# Patient Record
Sex: Female | Born: 1975
Health system: Southern US, Community
[De-identification: ages and names within clinical notes are randomized; demographics above are authoritative.]

## PROBLEM LIST (undated history)

## (undated) DIAGNOSIS — J302 Other seasonal allergic rhinitis: Secondary | ICD-10-CM

## (undated) DIAGNOSIS — K5190 Ulcerative colitis, unspecified, without complications: Secondary | ICD-10-CM

## (undated) DIAGNOSIS — N289 Disorder of kidney and ureter, unspecified: Secondary | ICD-10-CM

## (undated) DIAGNOSIS — N946 Dysmenorrhea, unspecified: Secondary | ICD-10-CM

## (undated) DIAGNOSIS — S3210XA Unspecified fracture of sacrum, initial encounter for closed fracture: Secondary | ICD-10-CM

## (undated) DIAGNOSIS — S52209A Unspecified fracture of shaft of unspecified ulna, initial encounter for closed fracture: Secondary | ICD-10-CM

## (undated) DIAGNOSIS — K649 Unspecified hemorrhoids: Secondary | ICD-10-CM

## (undated) DIAGNOSIS — D649 Anemia, unspecified: Secondary | ICD-10-CM

## (undated) DIAGNOSIS — G43909 Migraine, unspecified, not intractable, without status migrainosus: Secondary | ICD-10-CM

## (undated) DIAGNOSIS — F32A Depression, unspecified: Secondary | ICD-10-CM

## (undated) DIAGNOSIS — F419 Anxiety disorder, unspecified: Secondary | ICD-10-CM

## (undated) DIAGNOSIS — Z973 Presence of spectacles and contact lenses: Secondary | ICD-10-CM

## (undated) DIAGNOSIS — S5290XA Unspecified fracture of unspecified forearm, initial encounter for closed fracture: Secondary | ICD-10-CM

## (undated) DIAGNOSIS — Z87442 Personal history of urinary calculi: Secondary | ICD-10-CM

## (undated) DIAGNOSIS — K589 Irritable bowel syndrome without diarrhea: Secondary | ICD-10-CM

## (undated) DIAGNOSIS — N200 Calculus of kidney: Secondary | ICD-10-CM

## (undated) DIAGNOSIS — Z8489 Family history of other specified conditions: Secondary | ICD-10-CM

## (undated) DIAGNOSIS — Z860101 Personal history of adenomatous and serrated colon polyps: Secondary | ICD-10-CM

## (undated) DIAGNOSIS — K580 Irritable bowel syndrome with diarrhea: Secondary | ICD-10-CM

## (undated) DIAGNOSIS — R102 Pelvic and perineal pain: Secondary | ICD-10-CM

## (undated) DIAGNOSIS — Z789 Other specified health status: Secondary | ICD-10-CM

## (undated) DIAGNOSIS — N921 Excessive and frequent menstruation with irregular cycle: Secondary | ICD-10-CM

## (undated) DIAGNOSIS — G8929 Other chronic pain: Secondary | ICD-10-CM

## (undated) DIAGNOSIS — R519 Headache, unspecified: Secondary | ICD-10-CM

## (undated) DIAGNOSIS — N8003 Adenomyosis of the uterus: Secondary | ICD-10-CM

## (undated) HISTORY — DX: Headache, unspecified: R51.9

## (undated) HISTORY — DX: Irritable bowel syndrome, unspecified: K58.9

## (undated) HISTORY — DX: Anemia, unspecified: D64.9

## (undated) HISTORY — DX: Calculus of kidney: N20.0

## (undated) HISTORY — DX: Migraine, unspecified, not intractable, without status migrainosus: G43.909

## (undated) HISTORY — DX: Other chronic pain: G89.29

## (undated) HISTORY — DX: Ulcerative colitis, unspecified, without complications: K51.90

## (undated) HISTORY — DX: Depression, unspecified: F32.A

## (undated) HISTORY — DX: Anxiety disorder, unspecified: F41.9

---
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
## 2006-01-30 ENCOUNTER — Ambulatory Visit: Payer: Self-pay | Admitting: Internal Medicine

## 2006-01-31 ENCOUNTER — Ambulatory Visit: Payer: Self-pay | Admitting: Internal Medicine

## 2006-02-14 ENCOUNTER — Ambulatory Visit: Payer: Self-pay | Admitting: Internal Medicine

## 2006-06-22 ENCOUNTER — Ambulatory Visit: Payer: Self-pay | Admitting: Internal Medicine

## 2006-11-24 ENCOUNTER — Emergency Department (HOSPITAL_COMMUNITY): Admission: EM | Admit: 2006-11-24 | Discharge: 2006-11-24 | Payer: Self-pay | Admitting: Emergency Medicine

## 2007-02-23 ENCOUNTER — Inpatient Hospital Stay (HOSPITAL_COMMUNITY): Admission: AD | Admit: 2007-02-23 | Discharge: 2007-02-25 | Payer: Self-pay | Admitting: Obstetrics and Gynecology

## 2007-06-19 ENCOUNTER — Ambulatory Visit: Payer: Self-pay | Admitting: Internal Medicine

## 2007-09-01 ENCOUNTER — Ambulatory Visit (HOSPITAL_COMMUNITY): Admission: RE | Admit: 2007-09-01 | Discharge: 2007-09-01 | Payer: Self-pay | Admitting: Obstetrics and Gynecology

## 2007-10-11 ENCOUNTER — Ambulatory Visit: Payer: Self-pay | Admitting: Internal Medicine

## 2007-10-11 DIAGNOSIS — Z87442 Personal history of urinary calculi: Secondary | ICD-10-CM | POA: Insufficient documentation

## 2007-10-11 DIAGNOSIS — G43009 Migraine without aura, not intractable, without status migrainosus: Secondary | ICD-10-CM | POA: Insufficient documentation

## 2007-10-11 DIAGNOSIS — J309 Allergic rhinitis, unspecified: Secondary | ICD-10-CM | POA: Insufficient documentation

## 2008-05-12 ENCOUNTER — Ambulatory Visit: Payer: Self-pay | Admitting: Internal Medicine

## 2008-05-12 DIAGNOSIS — R1032 Left lower quadrant pain: Secondary | ICD-10-CM

## 2008-05-12 DIAGNOSIS — F4321 Adjustment disorder with depressed mood: Secondary | ICD-10-CM

## 2008-05-12 DIAGNOSIS — R110 Nausea: Secondary | ICD-10-CM | POA: Insufficient documentation

## 2008-05-12 LAB — CONVERTED CEMR LAB
AST: 22 units/L (ref 0–37)
Albumin: 4.4 g/dL (ref 3.5–5.2)
Alkaline Phosphatase: 77 units/L (ref 39–117)
Basophils Absolute: 0.1 10*3/uL (ref 0.0–0.1)
HCT: 40.6 % (ref 36.0–46.0)
Hemoglobin: 14 g/dL (ref 12.0–15.0)
Lymphocytes Relative: 26.4 % (ref 12.0–46.0)
MCV: 90.2 fL (ref 78.0–100.0)
Monocytes Absolute: 0.5 10*3/uL (ref 0.1–1.0)
Monocytes Relative: 6.3 % (ref 3.0–12.0)
Neutro Abs: 5.6 10*3/uL (ref 1.4–7.7)
RDW: 12.9 % (ref 11.5–14.6)
T3, Free: 3 pg/mL (ref 2.3–4.2)
TSH: 1.71 microintl units/mL (ref 0.35–5.50)
Total Bilirubin: 0.7 mg/dL (ref 0.3–1.2)
Total Protein: 8.2 g/dL (ref 6.0–8.3)
hCG, Beta Chain, Quant, S: 0.88 milliintl units/mL

## 2008-05-13 ENCOUNTER — Telehealth: Payer: Self-pay | Admitting: Internal Medicine

## 2008-05-28 ENCOUNTER — Ambulatory Visit: Payer: Self-pay | Admitting: Licensed Clinical Social Worker

## 2008-06-04 ENCOUNTER — Ambulatory Visit: Payer: Self-pay | Admitting: Licensed Clinical Social Worker

## 2008-06-30 ENCOUNTER — Ambulatory Visit: Payer: Self-pay | Admitting: Internal Medicine

## 2008-06-30 DIAGNOSIS — L738 Other specified follicular disorders: Secondary | ICD-10-CM

## 2009-01-06 ENCOUNTER — Encounter: Payer: Self-pay | Admitting: Internal Medicine

## 2009-06-01 ENCOUNTER — Inpatient Hospital Stay (HOSPITAL_COMMUNITY): Admission: AD | Admit: 2009-06-01 | Discharge: 2009-06-03 | Payer: Self-pay | Admitting: Obstetrics

## 2011-02-05 LAB — CBC
Platelets: 159 10*3/uL (ref 150–400)
RBC: 3.97 MIL/uL (ref 3.87–5.11)
RDW: 14.3 % (ref 11.5–15.5)

## 2011-05-26 LAB — ABO/RH

## 2011-05-26 LAB — ANTIBODY SCREEN: Antibody Screen: NEGATIVE

## 2011-11-01 NOTE — L&D Delivery Note (Signed)
Delivery Note  AROM at 2250 - dilation 7 cm Complete dilation at 2328 with strong urge to push Onset of pushing at 2328 - birth with single push FHR second stage 150 rate  Anesthesia: none  Delivery of a viable female at 2328 by CNM in LOA position.  Elmyra Ricks) Nuchal Cord- none. Cord double clamped after cessation of pulsation, cut by FOB.  Cord blood sample collected.    Placenta delivered 2335 intact with 3 VC.  Placenta to L&D for disposal. Uterine tone firm / small bleeding   no laceration identified Repair- none Est. Blood Loss (mL):300  Complications: none  Mom to postpartum.  Baby to Mom for breastfeeding.  Cathy Turner 12/07/2011, 11:47 PM

## 2011-11-17 LAB — STREP B DNA PROBE: GBS: NEGATIVE

## 2011-12-07 ENCOUNTER — Encounter (HOSPITAL_COMMUNITY): Payer: Self-pay | Admitting: *Deleted

## 2011-12-07 ENCOUNTER — Inpatient Hospital Stay (HOSPITAL_COMMUNITY)
Admission: AD | Admit: 2011-12-07 | Discharge: 2011-12-09 | DRG: 775 | Disposition: A | Discharge status: Home / Self Care | Payer: Managed Care, Other (non HMO) | Source: Ambulatory Visit | Attending: Obstetrics and Gynecology | Admitting: Obstetrics and Gynecology

## 2011-12-07 DIAGNOSIS — L738 Other specified follicular disorders: Secondary | ICD-10-CM

## 2011-12-07 DIAGNOSIS — G43009 Migraine without aura, not intractable, without status migrainosus: Secondary | ICD-10-CM

## 2011-12-07 DIAGNOSIS — R1032 Left lower quadrant pain: Secondary | ICD-10-CM

## 2011-12-07 DIAGNOSIS — Z87442 Personal history of urinary calculi: Secondary | ICD-10-CM

## 2011-12-07 DIAGNOSIS — R110 Nausea: Secondary | ICD-10-CM

## 2011-12-07 DIAGNOSIS — F4321 Adjustment disorder with depressed mood: Secondary | ICD-10-CM

## 2011-12-07 DIAGNOSIS — J309 Allergic rhinitis, unspecified: Secondary | ICD-10-CM

## 2011-12-07 HISTORY — DX: Other specified health status: Z78.9

## 2011-12-07 MED ORDER — CITRIC ACID-SODIUM CITRATE 334-500 MG/5ML PO SOLN: 30.0000 mL | ORAL | Status: DC | PRN

## 2011-12-07 MED ORDER — OXYTOCIN 10 UNIT/ML IJ SOLN
INTRAMUSCULAR | Status: AC
  Filled 2011-12-07: qty 2

## 2011-12-07 MED ORDER — IBUPROFEN 600 MG PO TABS
600.0000 mg | ORAL_TABLET | Freq: Four times a day (QID) | ORAL | Status: DC | PRN
  Administered 2011-12-08: 600 mg via ORAL
  Filled 2011-12-07: qty 1

## 2011-12-07 MED ORDER — ACETAMINOPHEN 325 MG PO TABS: 650.0000 mg | ORAL_TABLET | ORAL | Status: DC | PRN

## 2011-12-07 MED ORDER — LIDOCAINE HCL (PF) 1 % IJ SOLN: 30.0000 mL | INTRAMUSCULAR | Status: DC | PRN

## 2011-12-07 MED ORDER — LIDOCAINE HCL (PF) 1 % IJ SOLN
INTRAMUSCULAR | Status: AC
  Filled 2011-12-07: qty 30

## 2011-12-07 NOTE — ED Notes (Signed)
Pt to go to room 162 for evaluation.

## 2011-12-07 NOTE — Progress Notes (Signed)
Pt G3 P2 at 37.2wks, having contractions every .  SVE today 3-4cm.

## 2011-12-07 NOTE — H&P (Signed)
  OB ADMISSION/ HISTORY & PHYSICAL:  Admission Date: 12/07/2011  9:43 PM  Admit Diagnosis: Onset of labor  Cathy Turner is a 36 y.o. female presenting for active labor - onset around 6pm.  Prenatal History: G3P2 EDC : 12/26/2011 Prenatal care at [facility]  Prenatal course uncomplicated / hx SVD at 37-38 weeks with labors less than 7 hours  Prenatal Labs: ABO, Rh:    Antibody:  negative Rubella:   Immune RPR:   NR HBsAg:   NR HIV:   NR GBS:   negative 1 hr Glucola : nl  Medical / Surgical History :  Past medical history: No past medical history on file.   Past surgical history: No past surgical history on file.   Family History: No family history on file.   Social History:  does not have a smoking history on file. She does not have any smokeless tobacco history on file. Her alcohol and drug histories not on file.   Allergies: Review of patient's allergies indicates not on file.    Current Medications at time of admission:  Prenatal vitamin daily  Review of Systems: Ctx irregular since Sunday + mucus discharge with some blood this am Onset regular ctx ~6pm becoming uncomfortable by 7pm + pressure with ctx and more pink mucus discharge with voids  Physical Exam:   General: Uncomfortable with ctx Heart:RR Lungs: labored with ctx Abdomen: soft / gravid / non-tender Extremities: no edema Genitalia / VE: 5/95% / vtx / 0 / ROT / BBOW / + show  FHR: 145 / moderate TOCO: ctx every 2 minutes     Assessment: 37 2/7 weeks with active labor onset Planning natural childbirth - hx rapid labor progression after ROM  Plan:  Admission AROM with next cervical exam Prepare for birth  Nicolas Sisler 12/07/2011, 10:09 PM

## 2011-12-08 LAB — CBC
HCT: 34.4 % — ABNORMAL LOW (ref 36.0–46.0)
Hemoglobin: 11.3 g/dL — ABNORMAL LOW (ref 12.0–15.0)
MCH: 29.4 pg (ref 26.0–34.0)
MCHC: 32.8 g/dL (ref 30.0–36.0)
MCV: 89.4 fL (ref 78.0–100.0)
Platelets: 182 10*3/uL (ref 150–400)
RBC: 3.85 MIL/uL — ABNORMAL LOW (ref 3.87–5.11)
RDW: 13.9 % (ref 11.5–15.5)
WBC: 19 10*3/uL — ABNORMAL HIGH (ref 4.0–10.5)

## 2011-12-08 LAB — RPR: RPR Ser Ql: NONREACTIVE

## 2011-12-08 MED ORDER — IBUPROFEN 800 MG PO TABS
800.0000 mg | ORAL_TABLET | Freq: Three times a day (TID) | ORAL | Status: DC
  Administered 2011-12-08 – 2011-12-09 (×4): 800 mg via ORAL
  Filled 2011-12-08 (×5): qty 1

## 2011-12-08 MED ORDER — ACETAMINOPHEN 500 MG PO TABS: 1000.0000 mg | ORAL_TABLET | ORAL | Status: DC | PRN

## 2011-12-08 MED ORDER — SIMETHICONE 80 MG PO CHEW
80.0000 mg | CHEWABLE_TABLET | ORAL | Status: DC | PRN
Start: 2011-12-08 — End: 2011-12-09

## 2011-12-08 MED ORDER — WITCH HAZEL-GLYCERIN EX PADS
MEDICATED_PAD | CUTANEOUS | Status: DC | PRN
  Administered 2011-12-09: 06:00:00 via TOPICAL

## 2011-12-08 MED ORDER — MISOPROSTOL 200 MCG PO TABS
800.0000 ug | ORAL_TABLET | Freq: Once | ORAL | Status: AC | PRN
  Filled 2011-12-08: qty 4

## 2011-12-08 MED ORDER — LANOLIN HYDROUS EX OINT: TOPICAL_OINTMENT | CUTANEOUS | Status: DC | PRN

## 2011-12-08 MED ORDER — OXYCODONE-ACETAMINOPHEN 5-325 MG PO TABS: 1.0000 | ORAL_TABLET | ORAL | Status: DC | PRN

## 2011-12-08 MED ORDER — BENZOCAINE-MENTHOL 20-0.5 % EX AERO
1.0000 "application " | INHALATION_SPRAY | CUTANEOUS | Status: DC | PRN
  Filled 2011-12-08: qty 56

## 2011-12-08 MED ORDER — HYDROCORTISONE 2.5 % RE CREA
TOPICAL_CREAM | Freq: Three times a day (TID) | RECTAL | Status: DC
  Administered 2011-12-08: 19:00:00 via RECTAL
  Filled 2011-12-08 (×2): qty 28.35

## 2011-12-08 MED ORDER — PRENATAL MULTIVITAMIN CH
1.0000 | ORAL_TABLET | Freq: Every day | ORAL | Status: DC
  Administered 2011-12-08 – 2011-12-09 (×2): 1 via ORAL
  Filled 2011-12-08 (×2): qty 1

## 2011-12-08 MED ORDER — TETANUS-DIPHTH-ACELL PERTUSSIS 5-2.5-18.5 LF-MCG/0.5 IM SUSP
0.5000 mL | Freq: Once | INTRAMUSCULAR | Status: AC
  Administered 2011-12-08: 0.5 mL via INTRAMUSCULAR

## 2011-12-08 NOTE — Progress Notes (Signed)
Patient ID: Cathy Turner, female   DOB: 04/19/1976, 36 y.o.   MRN: [id number]  PPD 1 SVD  S:  Reports feeling well - + cramps             Tolerating po/ No nausea or vomiting             Bleeding is light             Pain controlled motrin             Up ad lib / ambulatory  Newborn breast feeding  / Circumcision discussed - considering   O:  A & O x 3 NAD             VS: Blood pressure 110/74, pulse 96, temperature 98.2 F (36.8 C), temperature source Axillary, resp. rate 18, height 5\' 6"  (1.676 m), weight 88.996 kg (196 lb 3.2 oz), unknown if currently breastfeeding.  LABS: WBC/Hgb/Hct/Plts:  19.0/11.3/34.4/182 (02/07 0545)   Lungs: Clear and unlabored  Heart: regular rate and rhythm / no mumurs  Abdomen: soft, non-tender, non-distended              Fundus: firm, non-tender, Ueven  Perineum: intact  Lochia: light  Extremities: no edema, no calf pain or tenderness    A: PPD # 1 SVD - no LAC   Doing well - stable status  P:  Routine post partum orders  Discharge in am  Brnadon Eoff 12/08/2011, 9:50 AM

## 2011-12-08 NOTE — Progress Notes (Signed)
UR chart review completed.  

## 2011-12-09 ENCOUNTER — Encounter (HOSPITAL_COMMUNITY): Payer: Self-pay | Admitting: *Deleted

## 2011-12-09 MED ORDER — IBUPROFEN 600 MG PO TABS: ORAL_TABLET | ORAL | Status: DC

## 2011-12-09 NOTE — Discharge Summary (Signed)
Obstetric Discharge Summary Reason for Admission: onset of labor and 37w 2d; Hx rapid labors after ROM Prenatal Procedures: ultrasound Intrapartum Procedures: spontaneous vaginal delivery Postpartum Procedures: none Complications-Operative and Postpartum: none Hemoglobin  Date Value Range Status  12/08/2011 11.3* 12.0-15.0 (g/dL) Final     HCT  Date Value Range Status  12/08/2011 34.4* 36.0-46.0 (%) Final    Discharge Diagnoses: Term Pregnancy-delivered  Discharge Information: Date: 12/09/2011 Activity: pelvic rest Diet: routine Medications: Ibuprofen Condition: stable Instructions: refer to practice specific booklet Discharge to: home   Newborn Data: Live born female on 12/07/11 Birth Weight: 7 lb 3 oz (3260 g) APGAR: 8, 9  Home with mother.  Demetrius Revel, MSN, [facility] 12/09/2011, 9:35 AM

## 2011-12-09 NOTE — Progress Notes (Signed)
Patient ID: Cathy Turner, female   DOB: [birth year], 36 y.o.   MRN: [id number]  PPD # 2  Subjective: Pt reports feeling well and eager for d/c home/ Pain controlled with ibuprofen Tolerating po/ Voiding without problems/ No n/v Bleeding is light/ Newborn info:  Information for the patient's newborn:  Lounell, Schumacher [[id number]]  female  / circ pending/ Feeding: breast    Objective:  VS: Blood pressure 127/85, pulse 96, temperature 98.1 F (36.7 C), temperature source Oral, resp. rate 18   Basename 12/08/11 0545  WBC 19.0*  HGB 11.3*  HCT 34.4*  PLT 182    Blood type: --/--/O POS (02/07 0545) Rubella: Immune (07/26 0000)    Physical Exam:  General: A & O x 3  alert, cooperative and no distress CV: Regular rate and rhythm Resp: clear Abdomen: soft, nontender, normal bowel sounds Uterine Fundus: firm, below umbilicus, nontender Perineum: not inspected Ext: Homans sign is negative, no sign of DVT and no edema, redness or tenderness in the calves or thighs    A/P: PPD # 2/ G3P3003/ S/P:spontaneous vaginal Doing well and stable for discharge home RX: Ibuprofen 600mg  po Q 6 hrs prn pain #30 Refill x 1 WOB/GYN booklet given Routine pp visit in 6wks   Demetrius Revel, MSN, [facility] 12/09/2011, 9:18 AM

## 2012-06-03 ENCOUNTER — Emergency Department (HOSPITAL_COMMUNITY): Payer: Managed Care, Other (non HMO)

## 2012-06-03 ENCOUNTER — Encounter (HOSPITAL_COMMUNITY): Payer: Self-pay | Admitting: *Deleted

## 2012-06-03 ENCOUNTER — Emergency Department (HOSPITAL_COMMUNITY)
Admission: EM | Admit: 2012-06-03 | Discharge: 2012-06-04 | Disposition: A | Discharge status: Home / Self Care | Payer: Managed Care, Other (non HMO) | Attending: Emergency Medicine | Admitting: Emergency Medicine

## 2012-06-03 DIAGNOSIS — S52123A Displaced fracture of head of unspecified radius, initial encounter for closed fracture: Secondary | ICD-10-CM | POA: Insufficient documentation

## 2012-06-03 DIAGNOSIS — Y9339 Activity, other involving climbing, rappelling and jumping off: Secondary | ICD-10-CM | POA: Insufficient documentation

## 2012-06-03 DIAGNOSIS — W1789XA Other fall from one level to another, initial encounter: Secondary | ICD-10-CM | POA: Insufficient documentation

## 2012-06-03 HISTORY — DX: Unspecified fracture of shaft of unspecified ulna, initial encounter for closed fracture: S52.209A

## 2012-06-03 HISTORY — DX: Unspecified fracture of sacrum, initial encounter for closed fracture: S32.10XA

## 2012-06-03 HISTORY — DX: Disorder of kidney and ureter, unspecified: N28.9

## 2012-06-03 HISTORY — DX: Unspecified fracture of unspecified forearm, initial encounter for closed fracture: S52.90XA

## 2012-06-03 MED ORDER — HYDROCODONE-ACETAMINOPHEN 5-325 MG PO TABS
1.0000 | ORAL_TABLET | Freq: Once | ORAL | Status: DC
  Filled 2012-06-03: qty 1

## 2012-06-03 NOTE — ED Provider Notes (Signed)
History     CSN: [id number]  Arrival date & time 06/03/12  2037   First MD Initiated Contact with Patient 06/03/12 2153      Chief Complaint  Patient presents with  . Arm Pain    R arm/shoulder post fall  . Tailbone Pain    post fall   HPI  History provided by the patient. Patient is a 36 year old female with previous history of left radial and ulnar fractures as well as history of sacral fracture in the past presents with complaints of right elbow pain and pain over to bone after a fall. Patient reports being on a pogo stick when she fell backwards landing on her tailbone and right elbow. Since that time she has had pain in the elbow with difficulty extending the elbow. Pain is also worse with supination and pronation. Patient has also had some discomfort with sitting on the telephone and low back area. Pain is not feel as severe as previous sacral fracture but is very tender. She denies any numbness or weakness in right hand or bilateral feet. Patient has been ambulatory. Patient took 100 mg of ibuprofen prior to arrival. She has not had significant relief. She is not using other treatments.     Past Medical History  Diagnosis Date  . No pertinent past medical history   . Ulna fracture     L  . Radial fracture     L  . Renal disorder     renal lithiasis  . Sacral fracture     Past Surgical History  Procedure Date  . No past surgeries     No family history on file.  History  Substance Use Topics  . Smoking status: Never Smoker   . Smokeless tobacco: Never Used  . Alcohol Use: No    OB History    Grav Para Term Preterm Abortions TAB SAB Ect Mult Living   3 3 3       3       Review of Systems  HENT: Negative for neck pain.   Musculoskeletal: Positive for back pain.       Right elbow pain  Neurological: Negative for weakness, numbness and headaches.    Allergies  Review of patient's allergies indicates no known allergies.  Home Medications  No current  outpatient prescriptions on file.  BP 132/95  Pulse 86  Temp 98.2 F (36.8 C) (Oral)  Resp 16  SpO2 98%  LMP 06/03/2012  Physical Exam  Nursing note and vitals reviewed. Constitutional: She is oriented to person, place, and time. She appears well-developed and well-nourished. No distress.  HENT:  Head: Normocephalic and atraumatic.  Neck: Normal range of motion. Neck supple.  Cardiovascular: Normal rate and regular rhythm.   Pulmonary/Chest: Effort normal and breath sounds normal.  Abdominal: Soft.  Musculoskeletal:       There is tenderness at the right lateral elbow. Patient has reduced range of motion and pain with extension. Patient has normal distal radial pulses, sensation in hand and fingers, cap refill less than 2 seconds in grip strength. No pain in right shoulder or clavicle area. Normal range of motion of shoulder.  There is moderate tenderness over sacral and coccyx area. No gross deformities.  Neurological: She is alert and oriented to person, place, and time.  Skin: Skin is warm and dry.  Psychiatric: She has a normal mood and affect. Her behavior is normal.    ED Course  Procedures  Dg Sacrum/coccyx  06/03/2012  *  RADIOLOGY REPORT*  Clinical Data: Fall, injury, pain  SACRUM AND COCCYX - 2+ VIEW  Comparison: None.  Findings: Symmetric SI joints.  No diastasis.  Sacral ala appear symmetric and intact.  No definite fracture evident or malalignment. Left hemi pelvis calcification consistent with phlebolith.  IMPRESSION: No acute osseous finding.  Original Report Authenticated By: Judie Petit. Ruel Favors, M.D.   Dg Elbow Complete Right  06/03/2012  *RADIOLOGY REPORT*  Clinical Data: Fall, pain  RIGHT ELBOW - COMPLETE 3+ VIEW  Comparison: None.  Findings: Impaction acute radial neck fracture noted.  Right elbow joint effusion evident with distention of the anterior posterior fat pads.  Elbow alignment remains anatomic.  Visualized humerus and ulna appear intact.  IMPRESSION: Acute  impacted right radial neck fracture with associated elbow effusion  Original Report Authenticated By: Judie Petit. Ruel Favors, M.D.   Dg Humerus Right  06/03/2012  *RADIOLOGY REPORT*  Clinical Data: Fall, pain  RIGHT HUMERUS - 2+ VIEW  Comparison: None.  Findings: Right humerus intact.  Normal alignment.  Irregularity of the radial neck noted at the elbow.  This may be related to projection however if there is elbow pain, consider dedicated elbow series to exclude radial neck fracture.  IMPRESSION: Negative for humerus fracture.  Cannot exclude radial neck fracture.  Please see above comment.  Original Report Authenticated By: Judie Petit. Ruel Favors, M.D.   Dg Hand Complete Right  06/03/2012  *RADIOLOGY REPORT*  Clinical Data: Fall, right arm and hand pain  RIGHT HAND - COMPLETE 3+ VIEW  Comparison: 06/03/2012  Findings: Normal alignment without fracture.  Preserved joint spaces.  No significant arthropathy.  No radiopaque foreign body.  IMPRESSION: No acute osseous finding  Original Report Authenticated By: Judie Petit. Ruel Favors, M.D.     1. Radial head fracture       MDM  10:20PM patient seen and evaluated. We'll obtain x-rays of elbow and sacrum and coccyx.   Patient placed in an arm splint with sling. Advised of diagnosis. Orthopedic referral provided.     Angus Seller, [place] 06/04/12 [phone]

## 2012-06-03 NOTE — ED Notes (Signed)
Pt was on a pogo stick, lost control and fell on her coccyx and R elbow.  Pain on rotation of R elbow and difficulty lifting R arm past  Waist d/t r shoulder pain.  Mild pain to sacrum when walking.  Pt took 800 mg ibuprofen at 1845.

## 2012-06-04 NOTE — ED Notes (Signed)
D/c instructions reviewed w/ pt and family - pt and family deny any further questions or concerns at present. Pt ambulating independently w/ steady gait on d/c in no acute distress, A&Ox4.  

## 2012-06-06 NOTE — ED Provider Notes (Signed)
Medical screening examination/treatment/procedure(s) were performed by non-physician practitioner and as supervising physician I was immediately available for consultation/collaboration.   Loren Racer, MD 06/06/12 [phone]

## 2014-09-01 ENCOUNTER — Encounter (HOSPITAL_COMMUNITY): Payer: Self-pay | Admitting: *Deleted

## 2017-03-29 ENCOUNTER — Telehealth: Payer: Self-pay | Admitting: Family Medicine

## 2017-03-29 NOTE — Telephone Encounter (Signed)
Unable to leave message/MB full, Per Dr. Beverely Lowabori, ok to schedule appt to  est care.

## 2017-03-29 NOTE — Telephone Encounter (Signed)
-----   Message from Sheliah HatchKatherine E Tabori, MD sent at 03/24/2017  4:30 PM EDT ----- Ok to establish  Thanks! KT ----- Message ----- From: Donzetta MattersJeffries, Angela H Sent: 03/24/2017   4:05 PM To: Sheliah HatchKatherine E Tabori, MD  Pt would like to est care with you, she states that she had moved out of town for a few years and has now moved back. Please advise ok to schedule.

## 2017-05-01 DIAGNOSIS — Z01419 Encounter for gynecological examination (general) (routine) without abnormal findings: Secondary | ICD-10-CM | POA: Diagnosis not present

## 2017-08-28 DIAGNOSIS — D649 Anemia, unspecified: Secondary | ICD-10-CM | POA: Diagnosis not present

## 2017-08-28 DIAGNOSIS — R739 Hyperglycemia, unspecified: Secondary | ICD-10-CM | POA: Diagnosis not present

## 2017-08-28 DIAGNOSIS — Z23 Encounter for immunization: Secondary | ICD-10-CM | POA: Diagnosis not present

## 2017-08-28 DIAGNOSIS — R5383 Other fatigue: Secondary | ICD-10-CM | POA: Diagnosis not present

## 2017-08-28 DIAGNOSIS — K5190 Ulcerative colitis, unspecified, without complications: Secondary | ICD-10-CM | POA: Diagnosis not present

## 2017-08-28 DIAGNOSIS — G43909 Migraine, unspecified, not intractable, without status migrainosus: Secondary | ICD-10-CM | POA: Diagnosis not present

## 2017-08-30 ENCOUNTER — Encounter: Payer: Self-pay | Admitting: Gastroenterology

## 2017-10-13 DIAGNOSIS — J069 Acute upper respiratory infection, unspecified: Secondary | ICD-10-CM | POA: Diagnosis not present

## 2017-10-13 DIAGNOSIS — H6592 Unspecified nonsuppurative otitis media, left ear: Secondary | ICD-10-CM | POA: Diagnosis not present

## 2017-10-13 DIAGNOSIS — J020 Streptococcal pharyngitis: Secondary | ICD-10-CM | POA: Diagnosis not present

## 2017-10-20 ENCOUNTER — Ambulatory Visit: Payer: Managed Care, Other (non HMO) | Admitting: Gastroenterology

## 2017-10-25 DIAGNOSIS — J029 Acute pharyngitis, unspecified: Secondary | ICD-10-CM | POA: Diagnosis not present

## 2017-11-06 DIAGNOSIS — Z0000 Encounter for general adult medical examination without abnormal findings: Secondary | ICD-10-CM | POA: Diagnosis not present

## 2017-11-06 DIAGNOSIS — Z139 Encounter for screening, unspecified: Secondary | ICD-10-CM | POA: Diagnosis not present

## 2017-11-14 DIAGNOSIS — Z23 Encounter for immunization: Secondary | ICD-10-CM | POA: Diagnosis not present

## 2017-11-14 DIAGNOSIS — Z0000 Encounter for general adult medical examination without abnormal findings: Secondary | ICD-10-CM | POA: Diagnosis not present

## 2017-12-05 ENCOUNTER — Encounter (INDEPENDENT_AMBULATORY_CARE_PROVIDER_SITE_OTHER): Payer: Self-pay

## 2017-12-05 ENCOUNTER — Encounter: Payer: Self-pay | Admitting: Gastroenterology

## 2017-12-05 ENCOUNTER — Ambulatory Visit (INDEPENDENT_AMBULATORY_CARE_PROVIDER_SITE_OTHER): Payer: 59 | Admitting: Gastroenterology

## 2017-12-05 VITALS — BP 112/68 | HR 64 | Ht 66.0 in | Wt 159.4 lb

## 2017-12-05 DIAGNOSIS — K5150 Left sided colitis without complications: Secondary | ICD-10-CM | POA: Diagnosis not present

## 2017-12-05 NOTE — Progress Notes (Signed)
HPI: This is a very pleasant 42 year old woman who was referred to me by Cathy Moccasin, MD  to evaluate history of ulcerative colitis.    Chief complaint is history of ulcerative colitis  About 10 years ago she was having some abdominal pains, changes in her bowels.  This is while she was pregnant.  She ended up undergoing a flexible sigmoidoscopy with Dr. Sherryll Turner and was found to have colitis.  May have been on a brief course of steroids for the distal UC.  Maybe not on meds.  Thinks she's had a couple flares: "extreme abd discomfort" feels stool moving in her bowel.  Sometimes looser bowels, rarely blood visible.  Sometimes mucousy.  Gassy.  Can coincide with times of extra stress.  If she thought she had a flare, she would drink more fluids, increase fiber, possibly an NSAIDs.  Generally lasting 4-5 days at most.  Moved back from [place] recently.  Grandmother had colon cancer in her 28s.  No other CRC in family.  In past couple years, no issues with her bowels.   Very infrequent blood on TP. Usual BM once daily to QOD.   Old Data Reviewed: March 2010 flexible sigmoidoscopy by Dr. Elnoria Turner.  He described inflammation from 5-25 cm from the anus, the mucosa proximal to 25 cm was normal.  Hemorrhoids.  Biopsies were taken.  I do not have the pathology report from this procedure.    Review of systems: Pertinent positive and negative review of systems were noted in the above HPI section. All other review negative.   Past Medical History:  Diagnosis Date  . Anemia   . No pertinent past medical history   . Radial fracture    L  . Renal disorder    renal lithiasis  . Sacral fracture (HCC)   . UC (ulcerative colitis) (HCC)   . Ulna fracture    L    Past Surgical History:  Procedure Laterality Date  . arm surgery Left 1992   Ulnar    Current Outpatient Medications  Medication Sig Dispense Refill  . Cholecalciferol (VITAMIN D3) 1000 units CAPS Take 1,000 Units by mouth daily.     . Cyanocobalamin (VITAMIN B 12 PO) Take by mouth daily.     No current facility-administered medications for this visit.     Allergies as of 12/05/2017  . (No Known Allergies)    Family History  Problem Relation Age of Onset  . Diabetes Father   . Heart disease Paternal Grandfather     Social History   Socioeconomic History  . Marital status: Married    Spouse name: Not on file  . Number of children: 3  . Years of education: Not on file  . Highest education level: Not on file  Social Needs  . Financial resource strain: Not on file  . Food insecurity - worry: Not on file  . Food insecurity - inability: Not on file  . Transportation needs - medical: Not on file  . Transportation needs - non-medical: Not on file  Occupational History  . Not on file  Tobacco Use  . Smoking status: Never Smoker  . Smokeless tobacco: Never Used  Substance and Sexual Activity  . Alcohol use: No  . Drug use: No  . Sexual activity: Yes    Birth control/protection: Other-see comments    Comment: plan vasectomy  Other Topics Concern  . Not on file  Social History Narrative  . Not on file  Physical Exam: BP 112/68   Pulse 64   Ht 5\' 6"  (1.676 m)   Wt 159 lb 6 oz (72.3 kg)   BMI 25.72 kg/m  Constitutional: generally well-appearing Psychiatric: alert and oriented x3 Eyes: extraocular movements intact Mouth: oral pharynx moist, no lesions Neck: supple no lymphadenopathy Cardiovascular: heart regular rate and rhythm Lungs: clear to auscultation bilaterally Abdomen: soft, nontender, nondistended, no obvious ascites, no peritoneal signs, normal bowel sounds Extremities: no lower extremity edema bilaterally Skin: no lesions on visible extremities   Assessment and plan: 42 y.o. female with history of left-sided colitis  First, we will get the results from her pathology from flexible sigmoidoscopy 2010.  She is not really sure if she was ever on medicines for this colitis.  I  wonder if she truly had left-sided ulcerative colitis or not.  These "flares" that she describes seem more likely to be IBS related.  I do not think she needs any testing now since she has been completely symptom-free for about 2 years.  I did recommend that if she has a flare of what she thinks is colitis, lasting for more than just 3 or 4 days she should call here and at that point I think she would need lab tests and probably colonoscopy.  We talked about colon cancer screening and she understands she would not be due until she turns around 52.  She has a single second-degree relative with colon cancer but no other risk factors.    Please see the "Patient Instructions" section for addition details about the plan.   Cathy Buntinganiel Lucilia Yanni, MD [facility] 12/05/2017, 8:26 AM  Cc: Cathy Moccasinewey, Cathy R, MD

## 2017-12-05 NOTE — Patient Instructions (Addendum)
Call if you have concerning problems with your bowels. Would likely recommend colonoscopy at that point.

## 2017-12-19 DIAGNOSIS — E559 Vitamin D deficiency, unspecified: Secondary | ICD-10-CM | POA: Diagnosis not present

## 2017-12-22 DIAGNOSIS — Z23 Encounter for immunization: Secondary | ICD-10-CM | POA: Diagnosis not present

## 2018-05-15 DIAGNOSIS — Z23 Encounter for immunization: Secondary | ICD-10-CM | POA: Diagnosis not present

## 2018-11-15 DIAGNOSIS — Z0000 Encounter for general adult medical examination without abnormal findings: Secondary | ICD-10-CM | POA: Diagnosis not present

## 2018-11-23 DIAGNOSIS — Z0000 Encounter for general adult medical examination without abnormal findings: Secondary | ICD-10-CM | POA: Diagnosis not present

## 2018-11-23 DIAGNOSIS — Z6825 Body mass index (BMI) 25.0-25.9, adult: Secondary | ICD-10-CM | POA: Diagnosis not present

## 2018-11-26 ENCOUNTER — Other Ambulatory Visit: Payer: Self-pay | Admitting: Family Medicine

## 2018-11-26 DIAGNOSIS — Z1231 Encounter for screening mammogram for malignant neoplasm of breast: Secondary | ICD-10-CM

## 2018-12-25 ENCOUNTER — Ambulatory Visit
Admission: RE | Admit: 2018-12-25 | Discharge: 2018-12-25 | Disposition: A | Discharge status: Home / Self Care | Payer: 59 | Source: Ambulatory Visit | Attending: Family Medicine | Admitting: Family Medicine

## 2018-12-25 DIAGNOSIS — Z1231 Encounter for screening mammogram for malignant neoplasm of breast: Secondary | ICD-10-CM

## 2019-08-07 ENCOUNTER — Other Ambulatory Visit: Payer: Self-pay | Admitting: Family Medicine

## 2019-08-07 DIAGNOSIS — R222 Localized swelling, mass and lump, trunk: Secondary | ICD-10-CM

## 2019-08-09 ENCOUNTER — Other Ambulatory Visit: Payer: 59

## 2019-08-14 ENCOUNTER — Ambulatory Visit
Admission: RE | Admit: 2019-08-14 | Discharge: 2019-08-14 | Disposition: A | Discharge status: Home / Self Care | Payer: 59 | Source: Ambulatory Visit | Attending: Family Medicine | Admitting: Family Medicine

## 2019-08-14 DIAGNOSIS — R222 Localized swelling, mass and lump, trunk: Secondary | ICD-10-CM

## 2019-11-30 ENCOUNTER — Other Ambulatory Visit: Payer: Self-pay | Admitting: Family Medicine

## 2019-11-30 DIAGNOSIS — Z1231 Encounter for screening mammogram for malignant neoplasm of breast: Secondary | ICD-10-CM

## 2020-01-07 ENCOUNTER — Other Ambulatory Visit: Payer: Self-pay

## 2020-01-07 ENCOUNTER — Ambulatory Visit
Admission: RE | Admit: 2020-01-07 | Discharge: 2020-01-07 | Disposition: A | Discharge status: Home / Self Care | Payer: 59 | Source: Ambulatory Visit | Attending: Family Medicine | Admitting: Family Medicine

## 2020-01-07 DIAGNOSIS — Z1231 Encounter for screening mammogram for malignant neoplasm of breast: Secondary | ICD-10-CM

## 2020-04-08 ENCOUNTER — Other Ambulatory Visit: Payer: Self-pay

## 2020-04-08 ENCOUNTER — Other Ambulatory Visit: Payer: Self-pay | Admitting: Family Medicine

## 2020-04-08 ENCOUNTER — Ambulatory Visit
Admission: RE | Admit: 2020-04-08 | Discharge: 2020-04-08 | Disposition: A | Discharge status: Home / Self Care | Payer: 59 | Source: Ambulatory Visit | Attending: Family Medicine | Admitting: Family Medicine

## 2020-04-08 DIAGNOSIS — R1032 Left lower quadrant pain: Secondary | ICD-10-CM

## 2020-12-15 ENCOUNTER — Other Ambulatory Visit: Payer: Self-pay | Admitting: Family Medicine

## 2020-12-15 DIAGNOSIS — Z1231 Encounter for screening mammogram for malignant neoplasm of breast: Secondary | ICD-10-CM

## 2021-02-04 ENCOUNTER — Inpatient Hospital Stay: Admission: RE | Admit: 2021-02-04 | Payer: 59 | Source: Ambulatory Visit

## 2021-03-30 ENCOUNTER — Ambulatory Visit
Admission: RE | Admit: 2021-03-30 | Discharge: 2021-03-30 | Disposition: A | Discharge status: Home / Self Care | Payer: 59 | Source: Ambulatory Visit | Attending: Family Medicine | Admitting: Family Medicine

## 2021-03-30 ENCOUNTER — Other Ambulatory Visit: Payer: Self-pay

## 2021-03-30 DIAGNOSIS — Z1231 Encounter for screening mammogram for malignant neoplasm of breast: Secondary | ICD-10-CM

## 2021-12-15 IMAGING — MG MM DIGITAL SCREENING BILAT W/ TOMO AND CAD
8 series · 8 of 24 positions shown · non-contrast
Comparison: Previous exam(s).

CLINICAL DATA: Screening.

EXAM:
DIGITAL SCREENING BILATERAL MAMMOGRAM WITH TOMOSYNTHESIS AND CAD
TECHNIQUE: Bilateral screening digital craniocaudal and mediolateral oblique
mammograms were obtained. Bilateral screening digital breast
tomosynthesis was performed. The images were evaluated with
computer-aided detection.

[R CC synth-2D]
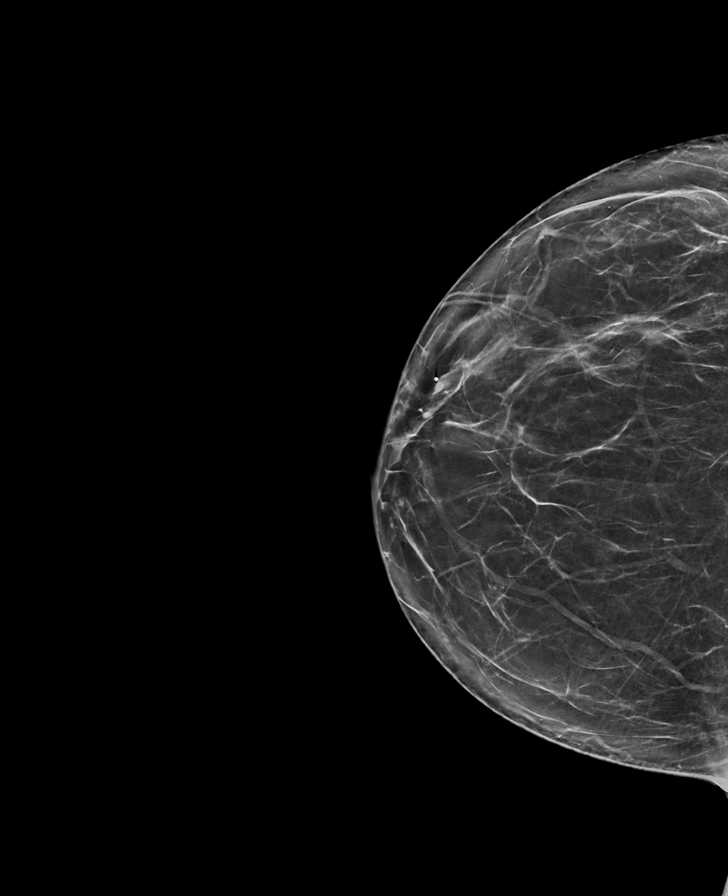

[L CC synth-2D]
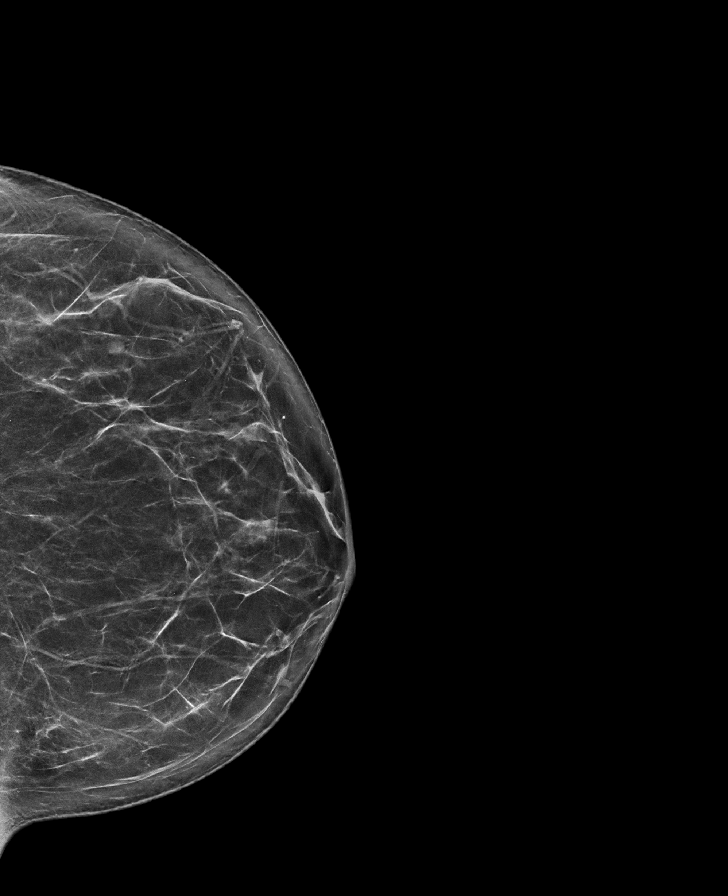

[L MLO synth-2D]
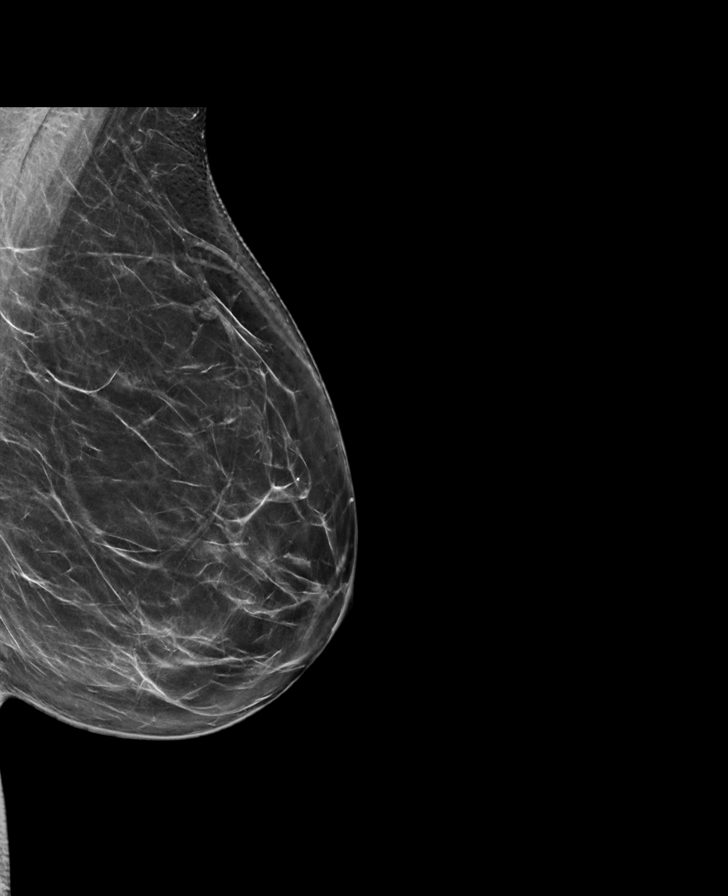

[R MLO synth-2D]
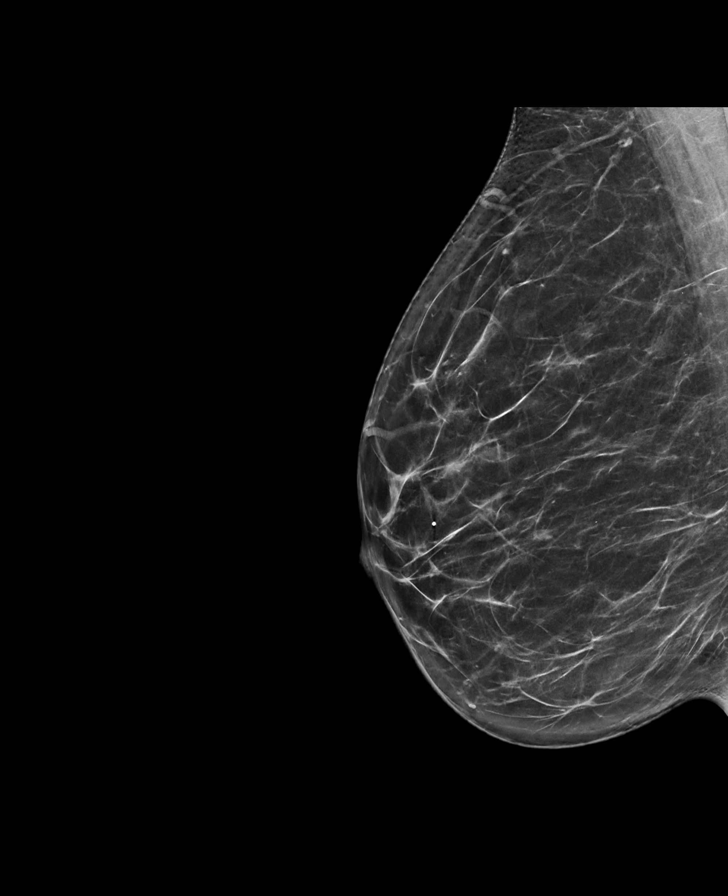

[R MLO tomo · tomo slice 37/73.0]
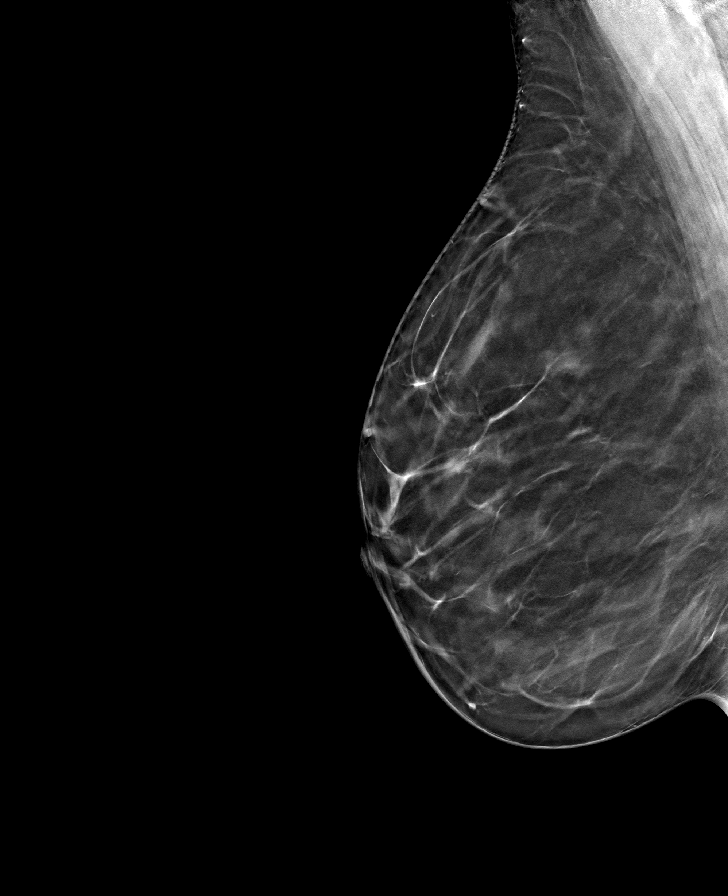

[L MLO tomo · tomo slice 41/82.0]
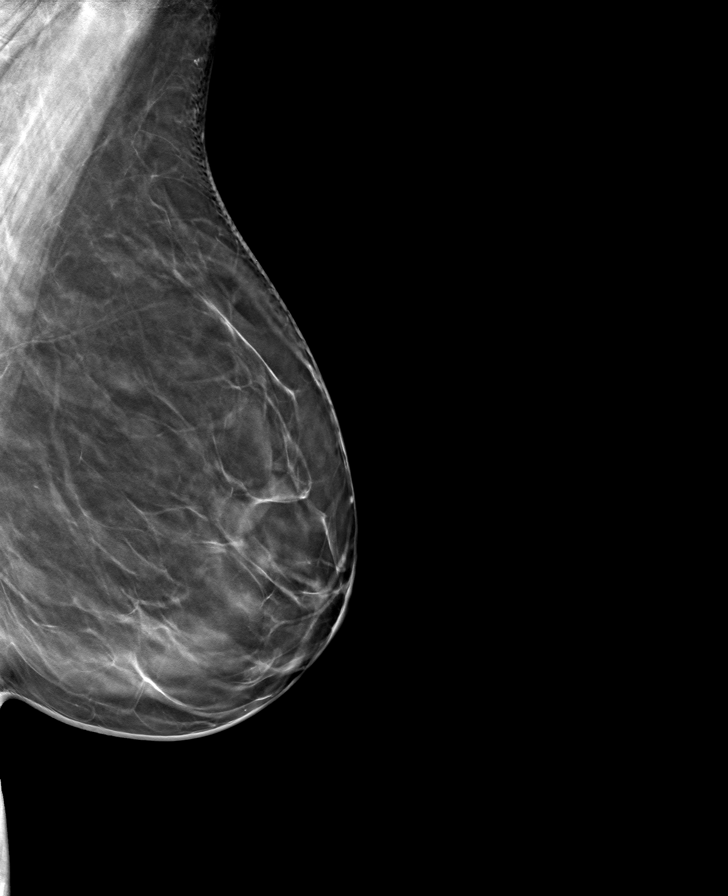

[R CC tomo · tomo slice 40/79.0]
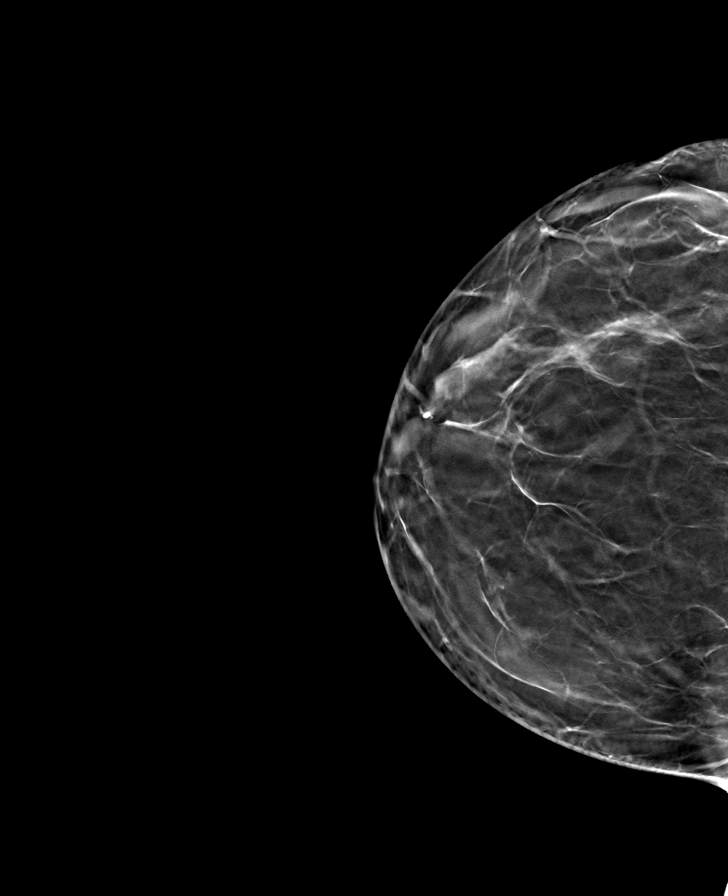

[L CC tomo · tomo slice 39/77.0]
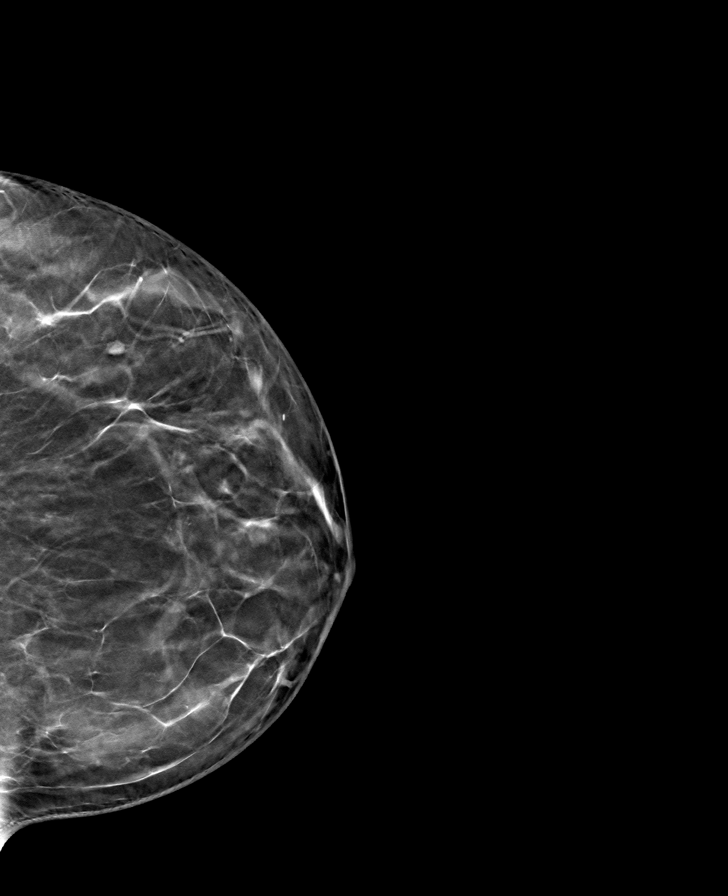

[8 of 24 positions shown; findings below may reference images not displayed]

ACR Breast Density Category b: There are scattered areas of
fibroglandular density.
FINDINGS: There are no findings suspicious for malignancy. The images were
evaluated with computer-aided detection.
IMPRESSION: No mammographic evidence of malignancy. A result letter of this
screening mammogram will be mailed directly to the patient.

RECOMMENDATION:
Screening mammogram in one year. (Code:WJ-[id number])

BI-RADS CATEGORY  1: Negative.

## 2022-12-29 ENCOUNTER — Encounter: Payer: Self-pay | Admitting: Family Medicine

## 2023-05-09 ENCOUNTER — Other Ambulatory Visit: Payer: Self-pay | Admitting: Family Medicine

## 2023-05-09 ENCOUNTER — Encounter: Payer: Self-pay | Admitting: Gastroenterology

## 2023-05-09 DIAGNOSIS — Z1231 Encounter for screening mammogram for malignant neoplasm of breast: Secondary | ICD-10-CM

## 2023-05-31 ENCOUNTER — Ambulatory Visit
Admission: RE | Admit: 2023-05-31 | Discharge: 2023-05-31 | Disposition: A | Discharge status: Home / Self Care | Payer: 59 | Source: Ambulatory Visit | Attending: Family Medicine | Admitting: Family Medicine

## 2023-05-31 DIAGNOSIS — Z1231 Encounter for screening mammogram for malignant neoplasm of breast: Secondary | ICD-10-CM

## 2023-07-20 ENCOUNTER — Encounter: Payer: Self-pay | Admitting: Gastroenterology

## 2023-07-20 ENCOUNTER — Ambulatory Visit (INDEPENDENT_AMBULATORY_CARE_PROVIDER_SITE_OTHER): Payer: 59 | Admitting: Gastroenterology

## 2023-07-20 VITALS — BP 118/70 | HR 96 | Ht 66.0 in | Wt 173.0 lb

## 2023-07-20 DIAGNOSIS — Z1211 Encounter for screening for malignant neoplasm of colon: Secondary | ICD-10-CM | POA: Insufficient documentation

## 2023-07-20 MED ORDER — NA SULFATE-K SULFATE-MG SULF 17.5-3.13-1.6 GM/177ML PO SOLN: 1.0000 | Freq: Once | ORAL | 0 refills | Status: AC

## 2023-07-20 NOTE — Patient Instructions (Signed)
You have been scheduled for a colonoscopy. Please follow written instructions given to you at your visit today.   Please pick up your prep supplies at the pharmacy within the next 1-3 days.  If you use inhalers (even only as needed), please bring them with you on the day of your procedure.  DO NOT TAKE 7 DAYS PRIOR TO TEST- Trulicity (dulaglutide) Ozempic, Wegovy (semaglutide) Mounjaro (tirzepatide) Bydureon Bcise (exanatide extended release)  DO NOT TAKE 1 DAY PRIOR TO YOUR TEST Rybelsus (semaglutide) Adlyxin (lixisenatide) Victoza (liraglutide) Byetta (exanatide) ___________________________________________________________________________  _______________________________________________________  If your blood pressure at your visit was 140/90 or greater, please contact your primary care physician to follow up on this.  _______________________________________________________  If you are age 45 or older, your body mass index should be between 23-30. Your Body mass index is 27.92 kg/m. If this is out of the aforementioned range listed, please consider follow up with your Primary Care Provider.  If you are age 21 or younger, your body mass index should be between 19-25. Your Body mass index is 27.92 kg/m. If this is out of the aformentioned range listed, please consider follow up with your Primary Care Provider.   ________________________________________________________  The [place] [facility] providers would like to encourage you to use [facility] to communicate with providers for non-urgent requests or questions.  Due to long hold times on the telephone, sending your provider a message by [facility] may be a faster and more efficient way to get a response.  Please allow 48 business hours for a response.  Please remember that this is for non-urgent requests.  _______________________________________________________

## 2023-07-20 NOTE — Progress Notes (Addendum)
07/20/2023 Cathy Turner [id number] May 22, 1976   HISTORY OF PRESENT ILLNESS: This is a 47 year old female who had been seen by Dr. Christella Hartigan once in February 2019.  It she has a questionable history of ulcerative colitis.  In 2010 she had a flexible sigmoidoscopy while she was pregnant and was found to have colitis (biopsies taken but I cannot see those).  At some point she may have been a brief course of steroids for distal ulcerative colitis, but not really sure.  She has had some other flares of symptoms with looser stools, but sometimes mucus, but not really any blood.  At her visit with Dr. Christella Hartigan in 2019 was more questionable if this was IBS as she felt that her symptoms worsened with stress.  Nonetheless, no medications were started.  She continues to have intermittent symptoms, she feels like her bowel movements are never really regular although sometimes normal, but other times loose. Has intermittent abdominal discomfort, gassiness and bloating.  She says that she was diagnosed with Hashimoto's disease and after starting levothyroxine that her symptoms did seem to improve.  She has also played around eith her diet, cutting out gluten and dairy, but never really being 100% consistent with that.  She is here today related to have screening colonoscopy.  Referred back here on this occasion by her PCP, Dr. Duanne Guess, for this reason.  Past Medical History:  Diagnosis Date   Anemia    Chronic headaches    Depression    IBS (irritable bowel syndrome)    Kidney stones    No pertinent past medical history    Radial fracture    L   Renal disorder    renal lithiasis   Sacral fracture (HCC)    UC (ulcerative colitis) (HCC)    Ulna fracture    L   Past Surgical History:  Procedure Laterality Date   arm surgery Left 1992   Ulnar    reports that she has never smoked. She has never used smokeless tobacco. She reports that she does not drink alcohol and does not use drugs. family  history includes Colon cancer in her paternal grandmother; Diabetes in her father; Heart disease in her paternal grandfather. No Known Allergies    Outpatient Encounter Medications as of 07/20/2023  Medication Sig   Cholecalciferol (VITAMIN D3) 1000 units CAPS Take 1,000 Units by mouth daily.   levothyroxine (SYNTHROID) 25 MCG tablet Take 25 mcg by mouth daily before breakfast. Pt taking 30 mg   sertraline (ZOLOFT) 100 MG tablet Take 100 mg by mouth every morning.   Ubrogepant (UBRELVY) 100 MG TABS Take by mouth.   Cyanocobalamin (VITAMIN B 12 PO) Take by mouth daily. (Patient not taking: Reported on 07/20/2023)   No facility-administered encounter medications on file as of 07/20/2023.    REVIEW OF SYSTEMS  : All other systems reviewed and negative except where noted in the History of Present Illness.   PHYSICAL EXAM: BP 118/70   Pulse 96   Ht 5\' 6"  (1.676 m)   Wt 173 lb (78.5 kg)   BMI 27.92 kg/m  General: Well developed white female in no acute distress Head: Normocephalic and atraumatic Eyes:  Sclerae anicteric, conjunctiva pink. Ears: Normal auditory acuity Lungs: Clear throughout to auscultation; no W/R/R. Heart: Regular rate and rhythm; no M/R/G. Abdomen: Soft, non-distended.  BS present.  Non-tender. Rectal:  Will be done at the time of colonoscopy. Musculoskeletal: Symmetrical with no gross deformities  Skin: No lesions on  visible extremities Extremities: No edema  Neurological: Alert oriented x 4, grossly non-focal Psychological:  Alert and cooperative. Normal mood and affect  ASSESSMENT AND PLAN: 47 year old female here to discuss screening colonoscopy.  Has a questionable history of ulcerative colitis in the past, but never on any long-term/maintenance medication.  Continues to have fluctuating/altered bowel habits, no blood, but does see mucus.  Question previously was diagnosis of IBS versus true ulcerative colitis.  Will plan for colonoscopy with Dr. Myrtie Neither.  The  risks, benefits, and alternatives to colonoscopy were discussed with the patient and she consents to proceed.   CC:  [name], MD

## 2023-07-24 NOTE — Progress Notes (Signed)
____________________________________________________________  Attending physician addendum:  Thank you for sending this case to me. I have reviewed the entire note and agree with the plan.   Amada Jupiter, MD  ____________________________________________________________

## 2023-08-18 ENCOUNTER — Telehealth: Payer: Self-pay | Admitting: Physician Assistant

## 2023-08-18 ENCOUNTER — Encounter: Payer: Self-pay | Admitting: *Deleted

## 2023-08-18 NOTE — Telephone Encounter (Signed)
PT rescheduled colonoscopy and needs instructions with new times. Please advise.

## 2023-08-18 NOTE — Telephone Encounter (Signed)
Called patient with new suprep instructions along with new date and time for colonoscopy schedule. Patient not available, left message on VM. Nurse will be both mailing and leaving instructions at the front desk for patient to obtain prior the scheduled date of 10/03/23.

## 2023-08-25 ENCOUNTER — Encounter: Payer: 59 | Admitting: Gastroenterology

## 2023-08-29 ENCOUNTER — Encounter: Payer: 59 | Admitting: Gastroenterology

## 2023-10-03 ENCOUNTER — Encounter: Payer: Self-pay | Admitting: Gastroenterology

## 2023-10-03 ENCOUNTER — Ambulatory Visit: Payer: 59 | Admitting: Gastroenterology

## 2023-10-03 VITALS — BP 122/74 | HR 77 | Temp 98.4°F | Resp 17 | Ht 66.0 in | Wt 173.0 lb

## 2023-10-03 DIAGNOSIS — K640 First degree hemorrhoids: Secondary | ICD-10-CM | POA: Diagnosis not present

## 2023-10-03 DIAGNOSIS — Z1211 Encounter for screening for malignant neoplasm of colon: Secondary | ICD-10-CM | POA: Diagnosis present

## 2023-10-03 DIAGNOSIS — D122 Benign neoplasm of ascending colon: Secondary | ICD-10-CM | POA: Diagnosis not present

## 2023-10-03 HISTORY — PX: COLONOSCOPY WITH PROPOFOL: SHX5780

## 2023-10-03 MED ORDER — SODIUM CHLORIDE 0.9 % IV SOLN: 500.0000 mL | Freq: Once | INTRAVENOUS | Status: DC

## 2023-10-03 NOTE — Progress Notes (Signed)
Pt's states no medical or surgical changes since previsit or office visit. 

## 2023-10-03 NOTE — Op Note (Signed)
[facility] Patient Name: Cathy Turner Procedure Date: 10/03/2023 10:43 AM MRN: [id number] Endoscopist: Sherilyn Cooter L. Myrtie Neither , MD, [id number] Age: 47 Referring MD:  Date of Birth: [birth year] Gender: Female Account #: [id number] Procedure:                Colonoscopy Indications:              Screening for colorectal malignant neoplasm, This                            is the patient's first colonoscopy Medicines:                Monitored Anesthesia Care Procedure:                Pre-Anesthesia Assessment:                           - Prior to the procedure, a History and Physical                            was performed, and patient medications and                            allergies were reviewed. The patient's tolerance of                            previous anesthesia was also reviewed. The risks                            and benefits of the procedure and the sedation                            options and risks were discussed with the patient.                            All questions were answered, and informed consent                            was obtained. Prior Anticoagulants: The patient has                            taken no anticoagulant or antiplatelet agents. ASA                            Grade Assessment: II - A patient with mild systemic                            disease. After reviewing the risks and benefits,                            the patient was deemed in satisfactory condition to                            undergo the procedure.  After obtaining informed consent, the colonoscope                            was passed under direct vision. Throughout the                            procedure, the patient's blood pressure, pulse, and                            oxygen saturations were monitored continuously. The                            Olympus Scope [id number] was introduced through the                            anus and advanced  to the the cecum, identified by                            appendiceal orifice and ileocecal valve. The                            colonoscopy was performed without difficulty. The                            patient tolerated the procedure well. The quality                            of the bowel preparation was good. The ileocecal                            valve, appendiceal orifice, and rectum were                            photographed. Scope In: 10:54:34 AM Scope Out: 11:10:43 AM Scope Withdrawal Time: 0 hours 12 minutes 19 seconds  Total Procedure Duration: 0 hours 16 minutes 9 seconds  Findings:                 The perianal and digital rectal examinations were                            normal.                           Repeat examination of right colon under NBI                            performed.                           A diminutive polyp was found in the proximal                            ascending colon. The polyp was sessile. The polyp  was removed with a cold snare. Resection and                            retrieval were complete.                           Internal hemorrhoids were found. The hemorrhoids                            were small and Grade I (internal hemorrhoids that                            do not prolapse).                           The exam was otherwise without abnormality on                            direct and retroflexion views. Complications:            No immediate complications. Estimated Blood Loss:     Estimated blood loss was minimal. Impression:               - One diminutive polyp in the proximal ascending                            colon, removed with a cold snare. Resected and                            retrieved.                           - Internal hemorrhoids.                           - The examination was otherwise normal on direct                            and retroflexion views. Recommendation:            - Patient has a contact number available for                            emergencies. The signs and symptoms of potential                            delayed complications were discussed with the                            patient. Return to normal activities tomorrow.                            Written discharge instructions were provided to the                            patient.                           -  Resume previous diet.                           - Continue present medications.                           - Await pathology results.                           - Repeat colonoscopy is recommended for                            surveillance. The colonoscopy date will be                            determined after pathology results from today's                            exam become available for review. Matasha Smigelski L. Myrtie Neither, MD 10/03/2023 11:14:10 AM This report has been signed electronically.

## 2023-10-03 NOTE — Progress Notes (Signed)
To pacu, VSS. Report to [email or web address] 

## 2023-10-03 NOTE — Patient Instructions (Addendum)
Resume previous diet.                           - Continue present medications.                           - Await pathology results.                           - Repeat colonoscopy is recommended for                            surveillance. The colonoscopy date will be                            determined after pathology results from today's                            exam become available for review.  Handout on polyp given.   YOU HAD AN ENDOSCOPIC PROCEDURE TODAY AT THE [facility]:   Refer to the procedure report that was given to you for any specific questions about what was found during the examination.  If the procedure report does not answer your questions, please call your gastroenterologist to clarify.  If you requested that your care partner not be given the details of your procedure findings, then the procedure report has been included in a sealed envelope for you to review at your convenience later.  YOU SHOULD EXPECT: Some feelings of bloating in the abdomen. Passage of more gas than usual.  Walking can help get rid of the air that was put into your GI tract during the procedure and reduce the bloating. If you had a lower endoscopy (such as a colonoscopy or flexible sigmoidoscopy) you may notice spotting of blood in your stool or on the toilet paper. If you underwent a bowel prep for your procedure, you may not have a normal bowel movement for a few days.  Please Note:  You might notice some irritation and congestion in your nose or some drainage.  This is from the oxygen used during your procedure.  There is no need for concern and it should clear up in a day or so.  SYMPTOMS TO REPORT IMMEDIATELY:  Following lower endoscopy (colonoscopy or flexible sigmoidoscopy):  Excessive amounts of blood in the stool  Significant tenderness or worsening of abdominal pains  Swelling of the abdomen that is new, acute  Fever of 100F or higher   For urgent or emergent issues, a  gastroenterologist can be reached at any hour by calling [phone]. Do not use MyChart messaging for urgent concerns.    DIET:  We do recommend a small meal at first, but then you may proceed to your regular diet.  Drink plenty of fluids but you should avoid alcoholic beverages for 24 hours.  ACTIVITY:  You should plan to take it easy for the rest of today and you should NOT DRIVE or use heavy machinery until tomorrow (because of the sedation medicines used during the test).    FOLLOW UP: Our staff will call the number listed on your records the next business day following your procedure.  We will call around 7:15- 8:00 am to check on you and address any questions or  concerns that you may have regarding the information given to you following your procedure. If we do not reach you, we will leave a message.     If any biopsies were taken you will be contacted by phone or by letter within the next 1-3 weeks.  Please call us at [phone] if you have not heard about the biopsies in 3 weeks.    SIGNATURES/CONFIDENTIALITY: You and/or your care partner have signed paperwork which will be entered into your electronic medical record.  These signatures attest to the fact that that the information above on your After Visit Summary has been reviewed and is understood.  Full responsibility of the confidentiality of this discharge information lies with you and/or your care-partner.

## 2023-10-03 NOTE — Progress Notes (Signed)
History and Physical:  This patient presents for endoscopic testing for: Encounter Diagnosis  Name Primary?   Special screening for malignant neoplasms, colon Yes    Average risk for colorectal cancer.  First screening exam.  Sigmoidoscopy in 2010 with nonspecific colitis, had treatment, then resolved.   Clinical history outlined in 07/20/23 office consult note. Has occ'l diarrhea that Dr Christella Hartigan suspected was IBS. Patient is otherwise without complaints or active issues today.  A grandparent also had CRC and sister had polyps.  Past Medical History: Past Medical History:  Diagnosis Date   Anemia    Chronic headaches    Depression    IBS (irritable bowel syndrome)    Kidney stones    No pertinent past medical history    Radial fracture    L   Renal disorder    renal lithiasis   Sacral fracture (HCC)    UC (ulcerative colitis) (HCC)    Ulna fracture    L     Past Surgical History: Past Surgical History:  Procedure Laterality Date   arm surgery Left 1992   Ulnar    Allergies: No Known Allergies  Outpatient Meds: Current Outpatient Medications  Medication Sig Dispense Refill   Cholecalciferol (VITAMIN D3) 1000 units CAPS Take 1,000 Units by mouth daily.     Cyanocobalamin (VITAMIN B 12 PO) Take by mouth daily.     levothyroxine (SYNTHROID) 25 MCG tablet Take 25 mcg by mouth daily before breakfast. Pt taking 30 mg     sertraline (ZOLOFT) 100 MG tablet Take 100 mg by mouth every morning.     Ubrogepant (UBRELVY) 100 MG TABS Take by mouth.     No current facility-administered medications for this visit.      ___________________________________________________________________ Objective   Exam:  BP 116/72   Pulse 97   Temp 98.4 F (36.9 C) (Temporal)   Ht 5\' 6"  (1.676 m)   Wt 173 lb (78.5 kg)   LMP 10/02/2023   SpO2 98%   BMI 27.92 kg/m   CV: regular , S1/S2 Resp: clear to auscultation bilaterally, normal RR and effort noted GI: soft, no tenderness,  with active bowel sounds.   Assessment: Encounter Diagnosis  Name Primary?   Special screening for malignant neoplasms, colon Yes     Plan: Colonoscopy   The benefits and risks of the planned procedure were described in detail with the patient or (when appropriate) their health care proxy.  Risks were outlined as including, but not limited to, bleeding, infection, perforation, adverse medication reaction leading to cardiac or pulmonary decompensation, pancreatitis (if ERCP).  The limitation of incomplete mucosal visualization was also discussed.  No guarantees or warranties were given.  The patient is appropriate for an endoscopic procedure in the ambulatory setting.   - Amada Jupiter, MD

## 2023-10-03 NOTE — Progress Notes (Signed)
Called to room to assist during endoscopic procedure.  Patient ID and intended procedure confirmed with present staff. Received instructions for my participation in the procedure from the performing physician.  

## 2023-10-04 ENCOUNTER — Telehealth: Payer: Self-pay | Admitting: *Deleted

## 2023-10-04 NOTE — Telephone Encounter (Signed)
  Follow up Call-     10/03/2023   10:30 AM  Call back number  Post procedure Call Back phone  # [phone]  Permission to leave phone message Yes     Patient questions:  Do you have a fever, pain , or abdominal swelling? No. Pain Score  0 *  Have you tolerated food without any problems? Yes.    Have you been able to return to your normal activities? Yes.    Do you have any questions about your discharge instructions: Diet   No. Medications  No. Follow up visit  No.  Do you have questions or concerns about your Care? No.  Actions: * If pain score is 4 or above: No action needed, pain <4.

## 2023-10-10 ENCOUNTER — Encounter: Payer: Self-pay | Admitting: Gastroenterology

## 2023-10-10 LAB — SURGICAL PATHOLOGY

## 2023-10-12 ENCOUNTER — Telehealth: Payer: Self-pay

## 2023-10-12 NOTE — Telephone Encounter (Signed)
Called patient and notified her of her follow up appointment- patient verbalized understanding.

## 2023-12-13 ENCOUNTER — Encounter: Payer: Self-pay | Admitting: Gastroenterology

## 2023-12-13 ENCOUNTER — Ambulatory Visit (INDEPENDENT_AMBULATORY_CARE_PROVIDER_SITE_OTHER): Payer: 59 | Admitting: Gastroenterology

## 2023-12-13 ENCOUNTER — Other Ambulatory Visit: Payer: 59

## 2023-12-13 VITALS — BP 118/72 | HR 78 | Ht 66.0 in | Wt 177.0 lb

## 2023-12-13 DIAGNOSIS — R140 Abdominal distension (gaseous): Secondary | ICD-10-CM

## 2023-12-13 DIAGNOSIS — K580 Irritable bowel syndrome with diarrhea: Secondary | ICD-10-CM | POA: Diagnosis not present

## 2023-12-13 MED ORDER — HYOSCYAMINE SULFATE 0.125 MG SL SUBL: 0.1250 mg | SUBLINGUAL_TABLET | Freq: Four times a day (QID) | SUBLINGUAL | 2 refills | Status: AC | PRN

## 2023-12-13 NOTE — Progress Notes (Signed)
[facility] Progress Note  Chief Complaint: Altered bowel habits  Subjective  Prior history  Clinical details in APP office note September 2024.  Previous patient of Dr. Christella Hartigan, intermittent diarrhea and urgency, questionable ulcerative colitis/proctitis during pregnancy in 2010.  Dr. Christella Hartigan thought patient's intermittent flares of symptoms more consistent with IBS. Screening colonoscopy with Dr. Myrtie Neither December 2024 -diminutive tubular adenoma (7-year recall), internal hemorrhoids, otherwise normal exam to cecum.  ____________________  Cathy Turner wanted to discuss her IBS today, and questions about the nature of the condition, potential triggers and available treatments. It occurs episodically, though may last for days when it does.  She has bloating lower abdominal cramps and stools that might be small pellets or loose.  Denies chronic upper digestive symptoms or rectal bleeding.  Last episode was a few weeks ago, has never taken any particular medicines for, no prior celiac testing.  She does note symptoms are worse under periods of stress.   ROS: Cardiovascular:  no chest pain Respiratory: no dyspnea  The patient's Past Medical, Family and Social History were reviewed and are on file in the EMR. Past Medical History:  Diagnosis Date   Anemia    Chronic headaches    Depression    IBS (irritable bowel syndrome)    Kidney stones    No pertinent past medical history    Radial fracture    L   Renal disorder    renal lithiasis   Sacral fracture (HCC)    UC (ulcerative colitis) (HCC)    Ulna fracture    L    Past Surgical History:  Procedure Laterality Date   arm surgery Left 1992   Ulnar     Objective:  Med list reviewed  Current Outpatient Medications:    Cholecalciferol (VITAMIN D3) 1000 units CAPS, Take 1,000 Units by mouth daily., Disp: , Rfl:    clindamycin (CLINDAGEL) 1 % gel, Apply 1 Application topically daily., Disp: , Rfl:    Cyanocobalamin (VITAMIN B 12  PO), Take by mouth daily., Disp: , Rfl:    estradiol (ESTRACE) 0.1 MG/GM vaginal cream, Place 1 Applicatorful vaginally., Disp: , Rfl:    hyoscyamine (LEVSIN SL) 0.125 MG SL tablet, Place 1 tablet (0.125 mg total) under the tongue every 6 (six) hours as needed., Disp: 30 tablet, Rfl: 2   NP THYROID 30 MG tablet, Take 30 mg by mouth daily., Disp: , Rfl:    sertraline (ZOLOFT) 100 MG tablet, Take 100 mg by mouth every morning., Disp: , Rfl:    Ubrogepant (UBRELVY) 100 MG TABS, Take by mouth., Disp: , Rfl:    fluticasone (FLONASE) 50 MCG/ACT nasal spray, Place 1 spray into both nostrils daily. (Patient not taking: Reported on 12/13/2023), Disp: , Rfl:    loratadine (CLARITIN) 10 MG tablet, Take by mouth daily as needed. (Patient not taking: Reported on 12/13/2023), Disp: , Rfl:    Vital signs in last 24 hrs: Vitals:   12/13/23 1058  BP: 118/72  Pulse: 78   Wt Readings from Last 3 Encounters:  12/13/23 177 lb (80.3 kg)  10/03/23 173 lb (78.5 kg)  07/20/23 173 lb (78.5 kg)    Physical Exam  Well-appearing-no additional exam.  Entire visit spent in review of records, symptoms and plan.   Labs:   ___________________________________________ Radiologic studies:   ____________________________________________ Other:  Colon polyp pathology report on file noted above _____________________________________________   Encounter Diagnoses  Name Primary?   Irritable bowel syndrome with diarrhea Yes  Abdominal bloating    Longstanding stable symptoms of IBS-D The chronic nature and treatment of IBS were discussed. The cause is not completely understood, but is likely to be a combination of genetics, diet, stress, visceral hypersensitivity and the gut microbiome.  The available treatments aim to control symptoms even if unable to "cure" the condition.  While not physically harmful, IBS can have a significant impact on quality of life.  She was concerned that this condition could lead to  inflammatory bowel disease or increased risk of colorectal cancer, but I reassured her that is not known to be true.  Some written dietary advice regarding common causes of maldigestion was given with her AVS, I also directed her to look up some online resources regarding a FODMAP diet.  I recommended celiac testing, she went to the lab for antibodies today.  Trial of hyoscyamine given for as needed use.  She will contact us through the portal with further questions or problems and I can see her as needed.  20 minutes were spent on this encounter (including chart review, history/exam, counseling/coordination of care, and documentation) > 50% of that time was spent on counseling and coordination of care.   Charlie Pitter III

## 2023-12-13 NOTE — Patient Instructions (Addendum)
Your provider has requested that you go to the basement level for lab work before leaving today. Press "B" on the elevator. The lab is located at the first door on the left as you exit the elevator.   _______________________________________________________  If your blood pressure at your visit was 140/90 or greater, please contact your primary care physician to follow up on this.  _______________________________________________________  If you are age 46 or older, your body mass index should be between 23-30. Your Body mass index is 28.57 kg/m. If this is out of the aforementioned range listed, please consider follow up with your Primary Care Provider.  If you are age 32 or younger, your body mass index should be between 19-25. Your Body mass index is 28.57 kg/m. If this is out of the aformentioned range listed, please consider follow up with your Primary Care Provider.   ________________________________________________________  The [facility] providers would like to encourage you to use [facility] to communicate with providers for non-urgent requests or questions.  Due to long hold times on the telephone, sending your provider a message by [facility] may be a faster and more efficient way to get a response.  Please allow 48 business hours for a response.  Please remember that this is for non-urgent requests.  _______________________________________________________ It was a pleasure to see you today!  Thank you for trusting me with your gastrointestinal care!    __________________________    _______________________________________________________  Food Guidelines for those with chronic digestive trouble:  Many people have difficulty digesting certain foods, causing a variety of distressing and embarrassing symptoms such as abdominal pain, bloating and gas.  These foods may need to be avoided or consumed in small amounts.  Here are some tips that might be helpful for you.  1.   Lactose  intolerance is the difficulty or complete inability to digest lactose, the natural sugar in milk and anything made from milk.  This condition is harmless, common, and can begin any time during life.  Some people can digest a modest amount of lactose while others cannot tolerate any.  Also, not all dairy products contain equal amounts of lactose.  For example, hard cheeses such as parmesan have less lactose than soft cheeses such as cheddar.  Yogurt has less lactose than milk or cheese.  Many packaged foods (even many brands of bread) have milk, so read ingredient lists carefully.  It is difficult to test for lactose intolerance, so just try avoiding lactose as much as possible for a week and see what happens with your symptoms.  If you seem to be lactose intolerant, the best plan is to avoid it (but make sure you get calcium from another source).  The next best thing is to use lactase enzyme supplements, available over the counter everywhere.  Just know that many lactose intolerant people need to take several tablets with each serving of dairy to avoid symptoms.  Lastly, a lot of restaurant food is made with milk or butter.  Many are things you might not suspect, such as mashed potatoes, rice and pasta (cooked with butter) and "grilled" items.  If you are lactose intolerant, it never hurts to ask your server what has milk or butter.  2.   Fiber is an important part of your diet, but not all fiber is well-tolerated.  Insoluble fiber such as bran is often consumed by normal gut bacteria and converted into gas.  Soluble fiber such as oats, squash, carrots and green beans are typically tolerated  better.  3.   Some types of carbohydrates can be poorly digested.  Examples include: fructose (apples, cherries, pears, raisins and other dried fruits), fructans (onions, zucchini, large amounts of wheat), sorbitol/mannitol/xylitol and sucralose/Splenda (common artificial sweeteners), and raffinose (lentils, broccoli,  cabbage, asparagus, brussel sprouts, many types of beans).  Do a Programmer, multimedia for [employer] [place] and you will find helpful information. Beano, a dietary supplement, will often help with raffinose-containing foods.  As with lactase tablets, you may need several per serving.  4.   Whenever possible, avoid processed food&meats and chemical additives.  High fructose corn syrup, a common sweetener, may be difficult to digest.  Eggs and soy (comes from the soybean, and added to many foods now) are other common bloating/gassy foods.  5.  Regarding gluten:  gluten is a protein mainly found in wheat, but also rye and barley.  There is a condition called celiac sprue, which is an inflammatory reaction in the small intestine causing a variety of digestive symptoms.  Blood testing is highly reliable to look for this condition, and sometimes upper endoscopy with small bowel biopsies may be necessary to make the diagnosis.  Many patients who test negative for celiac sprue report improvement in their digestive symptoms when they switch to a gluten-free diet.  However, in these "non-celiac gluten sensitive" patients, the true role of gluten in their symptoms is unclear.  Reducing carbohydrates in general may decrease the gas and bloating caused when gut bacteria consume carbs. Also, some of these patients may actually be intolerant of the baker's yeast in bread products rather than the gluten.  Flatbread and other reduced yeast breads might therefore be tolerated.  There is no specific testing available for most food intolerances, which are discovered mainly by dietary elimination.  Please do not embark on a gluten free diet unless directed by your doctor, as it is highly restrictive, and may lead to nutritional deficiencies if not carefully monitored.  Lastly, beware of internet claims offering "personalized" tests for food intolerances.  Such testing has no reliable scientific evidence to support its reliability and correlation  to symptoms.    6.  The best advice is old advice, especially for those with chronic digestive trouble - try to eat "clean".  Balanced diet, avoid processed food, plenty of fruits and vegetables, cut down the sugar, minimal alcohol, avoid tobacco. Make time to care for yourself, get enough sleep, exercise when you can, reduce stress.  Your guts will thank you for it.   - Dr. Sherlynn [facility]  ____________________________________________________________

## 2023-12-14 LAB — TISSUE TRANSGLUTAMINASE ABS,IGG,IGA
(tTG) Ab, IgA: <1 U/mL
(tTG) Ab, IgG: <1 U/mL

## 2023-12-14 LAB — IGA: Immunoglobulin A: 334 mg/dL — ABNORMAL HIGH (ref 47–310)

## 2023-12-15 ENCOUNTER — Encounter: Payer: Self-pay | Admitting: Gastroenterology

## 2024-02-26 ENCOUNTER — Encounter (HOSPITAL_BASED_OUTPATIENT_CLINIC_OR_DEPARTMENT_OTHER): Payer: Self-pay

## 2024-02-26 ENCOUNTER — Emergency Department (HOSPITAL_BASED_OUTPATIENT_CLINIC_OR_DEPARTMENT_OTHER)
Admission: EM | Admit: 2024-02-26 | Discharge: 2024-02-26 | Disposition: A | Discharge status: Home / Self Care | Attending: Emergency Medicine | Admitting: Emergency Medicine

## 2024-02-26 ENCOUNTER — Emergency Department (HOSPITAL_BASED_OUTPATIENT_CLINIC_OR_DEPARTMENT_OTHER)

## 2024-02-26 ENCOUNTER — Other Ambulatory Visit: Payer: Self-pay

## 2024-02-26 DIAGNOSIS — N939 Abnormal uterine and vaginal bleeding, unspecified: Secondary | ICD-10-CM | POA: Diagnosis present

## 2024-02-26 DIAGNOSIS — Z79899 Other long term (current) drug therapy: Secondary | ICD-10-CM | POA: Insufficient documentation

## 2024-02-26 DIAGNOSIS — N938 Other specified abnormal uterine and vaginal bleeding: Secondary | ICD-10-CM

## 2024-02-26 DIAGNOSIS — R1032 Left lower quadrant pain: Secondary | ICD-10-CM | POA: Diagnosis not present

## 2024-02-26 LAB — URINALYSIS, W/ REFLEX TO CULTURE (INFECTION SUSPECTED)
Bacteria, UA: NONE SEEN
Bilirubin Urine: NEGATIVE
Glucose, UA: NEGATIVE mg/dL
Ketones, ur: NEGATIVE mg/dL
Leukocytes,Ua: NEGATIVE
Nitrite: NEGATIVE
Protein, ur: NEGATIVE mg/dL
Specific Gravity, Urine: 1.022 (ref 1.005–1.030)
pH: 5.5 (ref 5.0–8.0)

## 2024-02-26 LAB — CBC
HCT: 38.5 % (ref 36.0–46.0)
Hemoglobin: 13 g/dL (ref 12.0–15.0)
MCH: 29.6 pg (ref 26.0–34.0)
MCHC: 33.8 g/dL (ref 30.0–36.0)
MCV: 87.7 fL (ref 80.0–100.0)
Platelets: 250 10*3/uL (ref 150–400)
RBC: 4.39 MIL/uL (ref 3.87–5.11)
RDW: 13.5 % (ref 11.5–15.5)
WBC: 6.9 10*3/uL (ref 4.0–10.5)
nRBC: 0 % (ref 0.0–0.2)

## 2024-02-26 LAB — BASIC METABOLIC PANEL WITH GFR
Anion gap: 11 (ref 5–15)
BUN: 11 mg/dL (ref 6–20)
CO2: 25 mmol/L (ref 22–32)
Calcium: 9.9 mg/dL (ref 8.9–10.3)
Chloride: 102 mmol/L (ref 98–111)
Creatinine, Ser: 0.83 mg/dL (ref 0.44–1.00)
GFR, Estimated: >60 mL/min (ref >60)
Glucose, Bld: 105 mg/dL — ABNORMAL HIGH (ref 70–99)
Potassium: 4 mmol/L (ref 3.5–5.1)
Sodium: 137 mmol/L (ref 135–145)

## 2024-02-26 LAB — PREGNANCY, URINE: Preg Test, Ur: NEGATIVE

## 2024-02-26 MED ORDER — SODIUM CHLORIDE 0.9 % IV BOLUS
1000.0000 mL | Freq: Once | INTRAVENOUS | Status: AC
  Administered 2024-02-26: 1000 mL via INTRAVENOUS

## 2024-02-26 NOTE — ED Provider Notes (Signed)
 [place] EMERGENCY DEPARTMENT AT [facility] Provider Note   CSN: [id number] Arrival date & time: 02/26/24  1533    History  Chief Complaint  Patient presents with   Abdominal Pain   Vaginal Bleeding    Cathy Turner is a 48 y.o. female here for evaluation of vaginal bleeding.  Currently following outpatient with primary provider for abnormal bleeding had 17-day cycle in January placed on low Loestrin.  End of last week she had some lower abdominal cramping and some dark vaginal bleeding.  Then started having heavier vaginal bleeding "felt like my menstrual cycle only heavier."  She has had some lightheadedness.  Some nausea when the pain increases however no vomiting.  Changing tampon every hour.  Denies chance of pregnancy.  No syncope, chest pain, shortness of breath, back pain.  States this afternoon her bleeding has tapered off however wanted to be assessed.  No dysuria, concern for STI, discharge.  She is having normal bowel movements.  HPI     Home Medications Prior to Admission medications   Medication Sig Start Date End Date Taking? Authorizing Provider  Cholecalciferol (VITAMIN D3) 1000 units CAPS Take 1,000 Units by mouth daily.    [provider]  clindamycin (CLINDAGEL) 1 % gel Apply 1 Application topically daily. 10/02/23   [provider]  Cyanocobalamin (VITAMIN B 12 PO) Take by mouth daily.    [provider]  estradiol (ESTRACE) 0.1 MG/GM vaginal cream Place 1 Applicatorful vaginally. 08/29/23   [provider]  fluticasone (FLONASE) 50 MCG/ACT nasal spray Place 1 spray into both nostrils daily. Patient not taking: Reported on 12/13/2023    [provider]  hyoscyamine  (LEVSIN SL) 0.125 MG SL tablet Place 1 tablet (0.125 mg total) under the tongue every 6 (six) hours as needed. 12/13/23   Albertina Hugger, MD  loratadine (CLARITIN) 10 MG tablet Take by mouth daily as needed. Patient not taking: Reported on  12/13/2023    [provider]  NP THYROID 30 MG tablet Take 30 mg by mouth daily. 07/26/23   [provider]  sertraline (ZOLOFT) 100 MG tablet Take 100 mg by mouth every morning. 06/14/23   [provider]  Ubrogepant (UBRELVY) 100 MG TABS Take by mouth.    [provider]      Allergies    Naltrexone    Review of Systems   Review of Systems  Constitutional: Negative.   HENT: Negative.    Respiratory: Negative.    Cardiovascular: Negative.   Gastrointestinal: Negative.   Genitourinary:  Positive for menstrual problem and vaginal bleeding. Negative for decreased urine volume, difficulty urinating, dyspareunia, dysuria, flank pain, frequency, hematuria, pelvic pain, urgency, vaginal discharge and vaginal pain.  Musculoskeletal: Negative.   Skin: Negative.   Neurological: Negative.   All other systems reviewed and are negative.   Physical Exam Updated Vital Signs BP 127/88 (BP Location: Right Arm)   Pulse 84   Temp 99.1 F (37.3 C) (Oral)   Resp 18   SpO2 100%  Physical Exam Vitals and nursing note reviewed.  Constitutional:      General: She is not in acute distress.    Appearance: She is well-developed. She is not ill-appearing.  HENT:     Head: Atraumatic.  Eyes:     Pupils: Pupils are equal, round, and reactive to light.  Cardiovascular:     Rate and Rhythm: Normal rate.     Heart sounds: Normal heart sounds.  Pulmonary:  Effort: Pulmonary effort is normal. No respiratory distress.     Breath sounds: Normal breath sounds.  Abdominal:     General: Bowel sounds are normal. There is no distension.     Palpations: Abdomen is soft.     Tenderness: There is abdominal tenderness in the suprapubic area. There is no right CVA tenderness, left CVA tenderness, guarding or rebound.  Musculoskeletal:        General: Normal range of motion.     Cervical back: Normal range of motion.  Skin:    General: Skin is warm and dry.  Neurological:      General: No focal deficit present.     Mental Status: She is alert.  Psychiatric:        Mood and Affect: Mood normal.     ED Results / Procedures / Treatments   Labs (all labs ordered are listed, but only abnormal results are displayed) Labs Reviewed  BASIC METABOLIC PANEL WITH GFR - Abnormal; Notable for the following components:      Result Value   Glucose, Bld 105 (*)    All other components within normal limits  URINALYSIS, W/ REFLEX TO CULTURE (INFECTION SUSPECTED) - Abnormal; Notable for the following components:   Hgb urine dipstick MODERATE (*)    All other components within normal limits  PREGNANCY, URINE  CBC    EKG None  Radiology US  PELVIC COMPLETE W TRANSVAGINAL AND TORSION R/O Result Date: 02/26/2024 CLINICAL DATA:  Left lower quadrant pelvic pain. EXAM: TRANSABDOMINAL AND TRANSVAGINAL ULTRASOUND OF PELVIS DOPPLER ULTRASOUND OF OVARIES TECHNIQUE: Both transabdominal and transvaginal ultrasound examinations of the pelvis were performed. Transabdominal technique was performed for global imaging of the pelvis including uterus, ovaries, adnexal regions, and pelvic cul-de-sac. It was necessary to proceed with endovaginal exam following the transabdominal exam to visualize the ovaries bilaterally. Color and duplex Doppler ultrasound was utilized to evaluate blood flow to the ovaries. COMPARISON:  None Available. FINDINGS: Uterus Measurements: 8.9 x 4.7 x 5.0 cm = volume: 109 mL. The uterus is anteverted. The cervix is unremarkable. No intrauterine masses are visualized. Endometrium Thickness: 8 mm.  No focal abnormality visualized. Right ovary Measurements: 3.1 x 1.6 x 2.1 cm = volume: 5 mL. Normal appearance/no adnexal mass. Left ovary Measurements: 4.2 x 2.7 x 2.2 cm = volume: 13 mL. Normal appearance/no adnexal mass. Pulsed Doppler evaluation of both ovaries demonstrates normal low-resistance arterial and venous waveforms. Other findings No abnormal free fluid. IMPRESSION:  1. Normal pelvic sonogram. Electronically Signed   By: Worthy Heads M.D.   On: 02/26/2024 21:31    Procedures Procedures    Medications Ordered in ED Medications  sodium chloride  0.9 % bolus 1,000 mL (0 mLs Intravenous Stopped 02/26/24 1917)    ED Course/ Medical Decision Making/ A&P    Here for lower abdominal pain and vaginal bleeding.  Being followed outpatient for abnormal cycles, had 17-day cycle in January placed on [facility].  Bleeding was initially every hour changing heavy tampon however has tapered off this afternoon according to patient.  Has some mild suprapubic tenderness however no rebound or guarding.  She is hemodynamically stable.  Will plan on labs, imaging  Labs and imaging personally viewed and interpreted:  UA with blood however menstrual cycle to be expected CBC without leukocytosis Metabolic panel without significant abnormality Pregnancy test negative US  not significant abnormality  Patient reassessed.  We discussed labs and imaging.  While waiting for labs and imaging patient's bleeding has essentially stopped.  I  recommend close follow-up with PCP or OB/GYN.  She will return for worsening symptoms.  On repeat exam patient does not have a surgical abdomin and there are no peritoneal signs.  No indication of appendicitis, bowel obstruction, bowel perforation, cholecystitis, diverticulitis, intermittent/persistent torsion, TOA, PID or ectopic pregnancy.     The patient has been appropriately medically screened and/or stabilized in the ED. I have low suspicion for any other emergent medical condition which would require further screening, evaluation or treatment in the ED or require inpatient management.  Patient is hemodynamically stable and in no acute distress.  Patient able to ambulate in department prior to ED.  Evaluation does not show acute pathology that would require ongoing or additional emergent interventions while in the emergency department or further  inpatient treatment.  I have discussed the diagnosis with the patient and answered all questions.  Pain is been managed while in the emergency department and patient has no further complaints prior to discharge.  Patient is comfortable with plan discussed in room and is stable for discharge at this time.  I have discussed strict return precautions for returning to the emergency department.  Patient was encouraged to follow-up with PCP/specialist refer to at discharge.                                Medical Decision Making Amount and/or Complexity of Data Reviewed External Data Reviewed: labs, radiology and notes. Labs: ordered. Decision-making details documented in ED Course. Radiology: ordered and independent interpretation performed. Decision-making details documented in ED Course.  Risk OTC drugs. Prescription drug management. Decision regarding hospitalization. Diagnosis or treatment significantly limited by social determinants of health.           Final Clinical Impression(s) / ED Diagnoses Final diagnoses:  Dysfunctional uterine bleeding    Rx / DC Orders ED Discharge Orders     None         Kamani Lewter A, PA-C 02/26/24 2220    Onetha Bile, MD 02/26/24 2341

## 2024-02-26 NOTE — Discharge Instructions (Signed)
 It was a pleasure taking care of you here in the emergency department  Keep close monitor of your symptoms.  Make sure to follow-up with your PCP tomorrow I have placed the number to an OB/GYN your contact information  Make sure to follow-up outpatient, return for any worsening symptoms

## 2024-02-26 NOTE — ED Triage Notes (Signed)
 Pt c/o lower abd pain- worse on L side, vaginal spotting onset Friday, "felt like period over the weekend but way heavier today." Associated feeling of lightheaded, nauseous.   Advises this AM, had super+ tampon, leaked through to pad 1hr, changing tampon q1h.

## 2024-02-29 ENCOUNTER — Encounter: Payer: Self-pay | Admitting: Obstetrics and Gynecology

## 2024-02-29 ENCOUNTER — Ambulatory Visit: Admitting: Obstetrics and Gynecology

## 2024-02-29 VITALS — BP 110/72 | HR 87 | Ht 65.0 in | Wt 179.0 lb

## 2024-02-29 DIAGNOSIS — N921 Excessive and frequent menstruation with irregular cycle: Secondary | ICD-10-CM

## 2024-02-29 DIAGNOSIS — Z1231 Encounter for screening mammogram for malignant neoplasm of breast: Secondary | ICD-10-CM

## 2024-02-29 NOTE — Patient Instructions (Signed)
 HI Giustina,  Here is a wrap up of options  Hormonal: mirena iud(progesterone only can be used for 8 years.  This can help transition through peri and menopause and reduce hot flashes and bleeding.  It can also provide some protection on the endometrial lining from endometrial cancer) or depo(worried about bone loss) and ocp's.  If she desires to stay on ocp's long term, discussed changing to progesterone only to lower risk of dvt, stroke.  Blood pressure in good range on lo/loestrin Discussed staying on this for now until she makes a decision. Other options with surgery: ablation: risk for concealed cancer in the future and entrapped blood with adenomyosis, robotic hysterectomy or D&C. Discussed the need for EMB to rule out precanerous or cancerous cells as a cause of the bleeding.  She will need updated pap smear with annual exam as well.  What I believe you have is adenomyosis: diagnosed by pathology. Hard to treat with hormones. Can cause heavy bleeding and pain.  It was so nice meeting you.  I will see you soon. Dr. Tia Flowers

## 2024-02-29 NOTE — Progress Notes (Signed)
 48 y.o. y.o. female here for ER follow-up for menorrhagia Bled for 17 days and went to the ER.  Started on lo/loestrin and the bleeding decreased but still having spotting and 3 weeks into the pack. Was using a pad and tampon q hour before ocps.  She started thge ocp's and then had severe LLQ pain.  Denies it was similar to pain she has had with her colon. She has IBS and UC and can tell what those flares feel like. She is a g3p3 and does not desire more children Last pap smear was 2 years ago. She has had anemia in the past 02/26/24 hg 13  Blood pressure 110/72, pulse 87, height 5\' 5"  (1.651 m), weight 179 lb (81.2 kg), last menstrual period 02/23/2024, SpO2 97%.  PUS Date: 02/26/2024 Department: [facility] Emergency Department at [facility] Released By/Authorizing: Gertie Kub, Britni A, PA-C (auto-released)   Exam Status  Status  Final [99]   PACS Intelerad Image Link   Show images for US  PELVIC COMPLETE W TRANSVAGINAL AND TORSION R/O Study Result  Narrative & Impression  CLINICAL DATA:  Left lower quadrant pelvic pain.   EXAM: TRANSABDOMINAL AND TRANSVAGINAL ULTRASOUND OF PELVIS   DOPPLER ULTRASOUND OF OVARIES   TECHNIQUE: Both transabdominal and transvaginal ultrasound examinations of the pelvis were performed. Transabdominal technique was performed for global imaging of the pelvis including uterus, ovaries, adnexal regions, and pelvic cul-de-sac.   It was necessary to proceed with endovaginal exam following the transabdominal exam to visualize the ovaries bilaterally. Color and duplex Doppler ultrasound was utilized to evaluate blood flow to the ovaries.   COMPARISON:  None Available.   FINDINGS: Uterus   Measurements: 8.9 x 4.7 x 5.0 cm = volume: 109 mL. The uterus is anteverted. The cervix is unremarkable. No intrauterine masses are visualized.   Endometrium   Thickness: 8 mm.  No focal abnormality visualized.   Right ovary   Measurements:  3.1 x 1.6 x 2.1 cm = volume: 5 mL. Normal appearance/no adnexal mass.   Left ovary   Measurements: 4.2 x 2.7 x 2.2 cm = volume: 13 mL. Normal appearance/no adnexal mass.   Pulsed Doppler evaluation of both ovaries demonstrates normal low-resistance arterial and venous waveforms.   Other findings   No abnormal free fluid.   IMPRESSION: 1. Normal pelvic sonogram.     Electronically Signed   By: Worthy Heads M.D.   On: 02/26/2024 21:31     No LMP recorded.     There is no height or weight on file to calculate BMI.      No data to display          There were no vitals taken for this visit.  No results found for: "DIAGPAP", "HPVHIGH", "ADEQPAP"  GYN HISTORY: No results found for: "DIAGPAP", "HPVHIGH", "ADEQPAP"  OB History  Gravida Para Term Preterm AB Living  3 3 3   3   SAB IAB Ectopic Multiple Live Births      1    # Outcome Date GA Lbr Len/2nd Weight Sex Type Anes PTL Lv  3 Term 12/07/11 [redacted]w[redacted]d 06:58 7 lb 3 oz (3.26 kg) M Vag-Spont None  LIV  2 Term           1 Term             Past Medical History:  Diagnosis Date   Anemia    Chronic headaches    Depression    IBS (irritable bowel syndrome)  Kidney stones    No pertinent past medical history    Radial fracture    L   Renal disorder    renal lithiasis   Sacral fracture (HCC)    UC (ulcerative colitis) (HCC)    Ulna fracture    L    Past Surgical History:  Procedure Laterality Date   arm surgery Left 1992   Ulnar    Current Outpatient Medications on File Prior to Visit  Medication Sig Dispense Refill   Cholecalciferol (VITAMIN D3) 1000 units CAPS Take 1,000 Units by mouth daily.     clindamycin (CLINDAGEL) 1 % gel Apply 1 Application topically daily.     Cyanocobalamin (VITAMIN B 12 PO) Take by mouth daily.     estradiol (ESTRACE) 0.1 MG/GM vaginal cream Place 1 Applicatorful vaginally.     fluticasone (FLONASE) 50 MCG/ACT nasal spray Place 1 spray into both nostrils daily.  (Patient not taking: Reported on 12/13/2023)     hyoscyamine  (LEVSIN SL) 0.125 MG SL tablet Place 1 tablet (0.125 mg total) under the tongue every 6 (six) hours as needed. 30 tablet 2   loratadine (CLARITIN) 10 MG tablet Take by mouth daily as needed. (Patient not taking: Reported on 12/13/2023)     NP THYROID 30 MG tablet Take 30 mg by mouth daily.     sertraline (ZOLOFT) 100 MG tablet Take 100 mg by mouth every morning.     Ubrogepant (UBRELVY) 100 MG TABS Take by mouth.     No current facility-administered medications on file prior to visit.    Social History   Socioeconomic History   Marital status: Married    Spouse name: Not on file   Number of children: 3   Years of education: Not on file   Highest education level: Not on file  Occupational History   Occupation: Runner, broadcasting/film/video  Tobacco Use   Smoking status: Never   Smokeless tobacco: Never  Vaping Use   Vaping status: Never Used  Substance and Sexual Activity   Alcohol use: No    Comment: occasional   Drug use: No   Sexual activity: Yes    Birth control/protection: Other-see comments    Comment: plan vasectomy  Other Topics Concern   Not on file  Social History Narrative   Not on file   Social Drivers of Health   Financial Resource Strain: Not on file  Food Insecurity: Not on file  Transportation Needs: Not on file  Physical Activity: Not on file  Stress: Not on file  Social Connections: Not on file  Intimate Partner Violence: Not on file    Family History  Problem Relation Age of Onset   Diabetes Father    Colon cancer Paternal Grandmother    Heart disease Paternal Grandfather    Rectal cancer Neg Hx    Stomach cancer Neg Hx    Esophageal cancer Neg Hx      Allergies  Allergen Reactions   Naltrexone Swelling      Patient's last menstrual period was No LMP recorded..             Review of Systems Alls systems reviewed and are negative.     OBGyn Exam    A:        DUB, likely adenomyosis                              P:  options with r/b/a/I of each were reviewed in detail and include: Hormonal: mirena iud(progesterone only can be used for 8 years.  This can help transition through peri and menopause and reduce hot flashes and bleeding) or depo(worried about bone loss) and ocp's.  If she desires to stay on ocp's long term, discussed changing to progesterone only to lower risk of dvt, stroke.  Blood pressure in good range on lo/loestrin Discussed staying on this for now until she makes a decision. Other options with surgery: ablation: risk for concealed cancer in the future and entrapped blood with adenomyosis, robotic hysterectomy or D&C. Discussed the need for EMB to rule out precanerous or cancerous cells as a cause of the bleeding.  She will need updated pap smear with annual exam as well. Patient will consider her options as well.  30 minutes spent on reviewing records, imaging,  and one on one patient time and counseling patient and documentation Dr. Tia Flowers   No follow-ups on file.  Cathy Turner

## 2024-03-05 NOTE — Addendum Note (Signed)
 Addended by: Reinaldo Caras on: 03/05/2024 03:11 PM   Modules accepted: Level of Service

## 2024-03-29 ENCOUNTER — Ambulatory Visit (INDEPENDENT_AMBULATORY_CARE_PROVIDER_SITE_OTHER): Admitting: Obstetrics and Gynecology

## 2024-03-29 ENCOUNTER — Encounter: Payer: Self-pay | Admitting: Obstetrics and Gynecology

## 2024-03-29 ENCOUNTER — Other Ambulatory Visit: Payer: Self-pay | Admitting: Obstetrics and Gynecology

## 2024-03-29 VITALS — BP 112/76 | HR 102 | Temp 98.2°F | Resp 16

## 2024-03-29 DIAGNOSIS — R102 Pelvic and perineal pain: Secondary | ICD-10-CM

## 2024-03-29 DIAGNOSIS — N946 Dysmenorrhea, unspecified: Secondary | ICD-10-CM

## 2024-03-29 DIAGNOSIS — N921 Excessive and frequent menstruation with irregular cycle: Secondary | ICD-10-CM

## 2024-03-29 DIAGNOSIS — N8003 Adenomyosis of the uterus: Secondary | ICD-10-CM

## 2024-03-29 MED ORDER — IBUPROFEN 800 MG PO TABS: 800.0000 mg | ORAL_TABLET | Freq: Three times a day (TID) | ORAL | 1 refills | Status: DC | PRN

## 2024-03-29 MED ORDER — PROGESTERONE MICRONIZED 100 MG PO CAPS
100.0000 mg | ORAL_CAPSULE | Freq: Every day | ORAL | 1 refills | Status: DC
Start: 2024-03-29 — End: 2024-03-29

## 2024-03-29 MED ORDER — OXYCODONE HCL 5 MG PO TABS: 5.0000 mg | ORAL_TABLET | ORAL | 0 refills | Status: DC | PRN

## 2024-03-29 NOTE — Telephone Encounter (Signed)
 Note from pharmacy - "Pharmacy comment: Script Clarification:PLEASE CLARIFY HOW MANY PATIENT IS TO TAKE IN ONE DAY. "

## 2024-03-29 NOTE — Progress Notes (Signed)
 48 y.o. y.o. female here for ER follow-up for menorrhagia and pelvic pain.   Bled for 17 days and went to the ER.  Started on lo/loestrin and the bleeding decreased but still having spotting and 3 weeks into the pack. Was using a pad and tampon q hour before ocps.  She started thge ocp's and then had severe LLQ pain.  Denies it was similar to pain she has had with her colon. She has IBS and UC and can tell what those flares feel like. She is a g3p3 and does not desire more children Last pap smear was 2 years ago. She has had anemia in the past 02/26/24 hg 13 She has been back to the ER for pain and reports the pain is worse with a full bladder and a few days before she has spotting or a period.  If her bowels are full as well, she will have more pain during this. Her cycles are all over.  She does not want to try an IUD. 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 She is alternating tylenol  and motrin  and still having pain. She denies any dysuria and has had kidney stones in the past and does not feel this is the same pain. She has completed a colonoscopy in 2024 and they could not contribute her pain to her bowels. She is frustrated with pain and irregular bleeding and would like to move forward with the [facility].  She is bent over in pain and in tears when it comes.  Blood pressure 112/76, pulse (!) 102, temperature 98.2 F (36.8 C), temperature source Oral, resp. rate 16, last menstrual period 03/15/2024, SpO2 98%.  PUS Date: 02/26/2024 Department: [facility] Emergency Department at [facility] Released By/Authorizing: Gertie Kub, Britni A, PA-C (auto-released)   Exam Status  Status  Final [99]   PACS Intelerad Image Link   Show images for US  PELVIC COMPLETE W TRANSVAGINAL AND TORSION R/O Study Result  Narrative & Impression  CLINICAL DATA:  Left lower quadrant pelvic pain.   EXAM: TRANSABDOMINAL AND  TRANSVAGINAL ULTRASOUND OF PELVIS   DOPPLER ULTRASOUND OF OVARIES   TECHNIQUE: Both transabdominal and transvaginal ultrasound examinations of the pelvis were performed. Transabdominal technique was performed for global imaging of the pelvis including uterus, ovaries, adnexal regions, and pelvic cul-de-sac.   It was necessary to proceed with endovaginal exam following the transabdominal exam to visualize the ovaries bilaterally. Color and duplex Doppler ultrasound was utilized to evaluate blood flow to the ovaries.   COMPARISON:  None Available.   FINDINGS: Uterus   Measurements: 8.9 x 4.7 x 5.0 cm = volume: 109 mL. The uterus is anteverted. The cervix is unremarkable. No intrauterine masses are visualized.   Endometrium   Thickness: 8 mm.  No focal abnormality visualized.   Right ovary   Measurements: 3.1 x 1.6 x 2.1 cm = volume: 5 mL. Normal appearance/no adnexal mass.   Left ovary   Measurements: 4.2 x 2.7 x 2.2 cm = volume: 13 mL. Normal appearance/no adnexal mass.   Pulsed Doppler evaluation of both ovaries demonstrates normal low-resistance arterial and venous waveforms.   Other findings   No abnormal free fluid.   IMPRESSION: 1. Normal pelvic sonogram.     Electronically Signed   By: Worthy Heads M.D.   On: 02/26/2024 21:31     Patient's last menstrual period was 03/15/2024 (exact date).  There is no height or weight on file to calculate BMI.      No data to display          Blood pressure 112/76, pulse (!) 102, temperature 98.2 F (36.8 C), temperature source Oral, resp. rate 16, last menstrual period 03/15/2024, SpO2 98%.  No results found for: "DIAGPAP", "HPVHIGH", "ADEQPAP"  GYN HISTORY: No results found for: "DIAGPAP", "HPVHIGH", "ADEQPAP"  OB History  Gravida Para Term Preterm AB Living  3 3 2 1  3   SAB IAB Ectopic Multiple Live Births      3    # Outcome Date GA Lbr Len/2nd Weight Sex Type Anes PTL Lv  3 Term  12/07/11 [redacted]w[redacted]d 06:58 7 lb 3 oz (3.26 kg) M Vag-Spont None  LIV  2 Preterm           1 Term             Past Medical History:  Diagnosis Date   Anemia    Anxiety    Chronic headaches    Depression    IBS (irritable bowel syndrome)    Kidney stones    Kidney stones    Migraines    No pertinent past medical history    Radial fracture    L   Renal disorder    renal lithiasis   Sacral fracture (HCC)    UC (ulcerative colitis) (HCC)    Ulna fracture    L    Past Surgical History:  Procedure Laterality Date   arm surgery Left 1992   Ulnar    Current Outpatient Medications on File Prior to Visit  Medication Sig Dispense Refill   Cholecalciferol (VITAMIN D3) 1000 units CAPS Take 1,000 Units by mouth daily.     fluticasone (FLONASE) 50 MCG/ACT nasal spray Place 1 spray into both nostrils daily.     hyoscyamine  (LEVSIN SL) 0.125 MG SL tablet Place 1 tablet (0.125 mg total) under the tongue every 6 (six) hours as needed. 30 tablet 2   loratadine (CLARITIN) 10 MG tablet Take by mouth daily as needed.     sertraline (ZOLOFT) 100 MG tablet Take 100 mg by mouth every morning.     Ubrogepant (UBRELVY) 100 MG TABS Take by mouth.     tretinoin (RETIN-A) 0.025 % cream SMARTSIG:1 Application Topical Every Evening (Patient not taking: Reported on 03/29/2024)     No current facility-administered medications on file prior to visit.    Social History   Socioeconomic History   Marital status: Married    Spouse name: Not on file   Number of children: 3   Years of education: Not on file   Highest education level: Not on file  Occupational History   Occupation: Runner, broadcasting/film/video  Tobacco Use   Smoking status: Never   Smokeless tobacco: Never  Vaping Use   Vaping status: Never Used  Substance and Sexual Activity   Alcohol use: Yes    Comment: occasional   Drug use: No   Sexual activity: Yes    Partners: Male    Birth control/protection: None  Other Topics Concern   Not on file  Social  History Narrative   Not on file   Social Drivers of Health   Financial Resource Strain: Not on file  Food Insecurity: Not on file  Transportation Needs: Not on file  Physical Activity: Not on file  Stress: Not on file  Social Connections: Not on file  Intimate Partner Violence: Not on file  Family History  Problem Relation Age of Onset   Thyroid disease Mother    Hypertension Mother    Diabetes Father        type 2   Colon cancer Paternal Grandmother    Heart disease Paternal Grandfather    Diabetes Daughter        type 1     Allergies  Allergen Reactions   Naltrexone Swelling      Patient's last menstrual period was Patient's last menstrual period was 03/15/2024 (exact date)..             Review of Systems Alls systems reviewed and are negative.     OBGyn Exam    A:        DUB, likely adenomyosis                             P:      All options discussed.  She would like to move forward with the [facility]. The procedure was reviewed in detail. To begin daily prometrium  to stop bleeding and hopefully reduce cramping.  May increase to two tablets at bedtime with periods of bleeding. RTC next week for EMB. Surgery PA placed. She has annual exam scheduled. Offered CT scan.  20 minutes spent on reviewing records, imaging,  and one on one patient time and counseling patient and documentation Dr. Tia Flowers   No follow-ups on file.  Reinaldo Caras

## 2024-03-31 LAB — URINALYSIS, COMPLETE W/RFL CULTURE
Bilirubin Urine: NEGATIVE
Glucose, UA: NEGATIVE
Hyaline Cast: NONE SEEN /LPF
Ketones, ur: NEGATIVE
Leukocyte Esterase: NEGATIVE
Nitrites, Initial: NEGATIVE
Protein, ur: NEGATIVE
Specific Gravity, Urine: 1.01 (ref 1.001–1.035)
pH: 7 (ref 5.0–8.0)

## 2024-03-31 LAB — URINE CULTURE
MICRO NUMBER:: 16519648
Result:: NO GROWTH
SPECIMEN QUALITY:: ADEQUATE

## 2024-03-31 LAB — CULTURE INDICATED

## 2024-04-01 ENCOUNTER — Ambulatory Visit: Payer: Self-pay | Admitting: Obstetrics and Gynecology

## 2024-04-04 ENCOUNTER — Other Ambulatory Visit (HOSPITAL_COMMUNITY)
Admission: RE | Admit: 2024-04-04 | Discharge: 2024-04-04 | Disposition: A | Discharge status: Home / Self Care | Source: Ambulatory Visit | Attending: Obstetrics and Gynecology | Admitting: Obstetrics and Gynecology

## 2024-04-04 ENCOUNTER — Encounter: Payer: Self-pay | Admitting: Obstetrics and Gynecology

## 2024-04-04 ENCOUNTER — Ambulatory Visit (INDEPENDENT_AMBULATORY_CARE_PROVIDER_SITE_OTHER): Admitting: Obstetrics and Gynecology

## 2024-04-04 VITALS — BP 124/82 | HR 89

## 2024-04-04 DIAGNOSIS — Z01812 Encounter for preprocedural laboratory examination: Secondary | ICD-10-CM

## 2024-04-04 DIAGNOSIS — N921 Excessive and frequent menstruation with irregular cycle: Secondary | ICD-10-CM | POA: Diagnosis present

## 2024-04-04 LAB — PREGNANCY, URINE: Preg Test, Ur: NEGATIVE

## 2024-04-04 NOTE — Progress Notes (Signed)
   Acute Office Visit  Subjective:    Patient ID: Cathy Turner, female    DOB: 08/01/1976, 48 y.o.   MRN: [id number]   HPI 48 y.o. presents today for No chief complaint on file. . DUB, likely adenomyosis for EMB before [facility]  Patient's last menstrual period was 03/15/2024 (exact date).    Review of Systems     PROCEDURE: EMB Consent obtained for the procedure.  Time out performed. A bivalve speculum was placed in the vagina.  The cervix was grasped with a single tooth tenaculum. Uterus sound to 8cm.   Pipelle was inserted and rotated.  Adequate specimen was obtained and sent to pathology.  All instruments were removed.  Patient tolerated the procedure well.  To notify patient of the results.  Objective:     OBGyn Exam  BP 124/82 (BP Location: Left Arm, Patient Position: Sitting)   Pulse 89   LMP 03/15/2024 (Exact Date)   SpO2 97%  Wt Readings from Last 3 Encounters:  02/29/24 179 lb (81.2 kg)  12/13/23 177 lb (80.3 kg)  10/03/23 173 lb (78.5 kg)        Jada, CMA was present for the entire procedure    Assessment & Plan:  DUB, likely adenomyosis for EMB today prior to [facility] EMB collected.  She is ready to schedule Annual next week   Reinaldo Caras

## 2024-04-08 ENCOUNTER — Ambulatory Visit: Payer: Self-pay | Admitting: Obstetrics and Gynecology

## 2024-04-08 LAB — SURGICAL PATHOLOGY

## 2024-04-09 ENCOUNTER — Ambulatory Visit: Admitting: Obstetrics and Gynecology

## 2024-04-12 ENCOUNTER — Ambulatory Visit (INDEPENDENT_AMBULATORY_CARE_PROVIDER_SITE_OTHER): Admitting: Obstetrics and Gynecology

## 2024-04-12 ENCOUNTER — Encounter: Payer: Self-pay | Admitting: Obstetrics and Gynecology

## 2024-04-12 ENCOUNTER — Other Ambulatory Visit (HOSPITAL_COMMUNITY)
Admission: RE | Admit: 2024-04-12 | Discharge: 2024-04-12 | Disposition: A | Discharge status: Home / Self Care | Source: Ambulatory Visit | Attending: Obstetrics and Gynecology | Admitting: Obstetrics and Gynecology

## 2024-04-12 VITALS — BP 110/64 | HR 88 | Ht 65.0 in | Wt 176.4 lb

## 2024-04-12 DIAGNOSIS — Z01419 Encounter for gynecological examination (general) (routine) without abnormal findings: Secondary | ICD-10-CM

## 2024-04-12 DIAGNOSIS — N898 Other specified noninflammatory disorders of vagina: Secondary | ICD-10-CM | POA: Diagnosis not present

## 2024-04-12 NOTE — Progress Notes (Signed)
 48 y.o. y.o. female here for ER follow-up for menorrhagia and pelvic pain.   Bled for 17 days and went to the ER.  Started on lo/loestrin and the bleeding decreased but still having spotting and 3 weeks into the pack. Was using a pad and tampon q hour before ocps.  She started thge ocp's and then had severe LLQ pain.  Denies it was similar to pain she has had with her colon. She has IBS and UC and can tell what those flares feel like. She is a g3p3 and does not desire more children Last pap smear was 2 years ago. She has had anemia in the past 02/26/24 hg 13 She has been back to the ER for pain and reports the pain is worse with a full bladder and a few days before she has spotting or a period.  If her bowels are full as well, she will have more pain during this. Her cycles are all over.  She does not want to try an IUD. 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 She is alternating tylenol  and motrin  and still having pain. She denies any dysuria and has had kidney stones in the past and does not feel this is the same pain. She has completed a colonoscopy in 2024 and they could not contribute her pain to her bowels. She is frustrated with pain and irregular bleeding and would like to move forward with the [facility].  She is bent over in pain and in tears when it comes.  Blood pressure 110/64, pulse 88, height 5' 5 (1.651 m), weight 176 lb 6.4 oz (80 kg), last menstrual period 03/18/2024, SpO2 100%.   Health Maintenance MMG: 05/31/23 Colonoscopy 10/03/23 Pap smear 04/12/24 HRT: prometrium  at bedtime- has helped with sleep    PUS Date: 02/26/2024 Department: [facility] Emergency Department at [facility] Released By/Authorizing: Henderly, Britni A, PA-C (auto-released)   Exam Status  Status  Final [99]   PACS Intelerad Image Link   Show images for US  PELVIC COMPLETE W TRANSVAGINAL AND TORSION R/O Study  Result  Narrative & Impression  CLINICAL DATA:  Left lower quadrant pelvic pain.   EXAM: TRANSABDOMINAL AND TRANSVAGINAL ULTRASOUND OF PELVIS   DOPPLER ULTRASOUND OF OVARIES   TECHNIQUE: Both transabdominal and transvaginal ultrasound examinations of the pelvis were performed. Transabdominal technique was performed for global imaging of the pelvis including uterus, ovaries, adnexal regions, and pelvic cul-de-sac.   It was necessary to proceed with endovaginal exam following the transabdominal exam to visualize the ovaries bilaterally. Color and duplex Doppler ultrasound was utilized to evaluate blood flow to the ovaries.   COMPARISON:  None Available.   FINDINGS: Uterus   Measurements: 8.9 x 4.7 x 5.0 cm = volume: 109 mL. The uterus is anteverted. The cervix is unremarkable. No intrauterine masses are visualized.   Endometrium   Thickness: 8 mm.  No focal abnormality visualized.   Right ovary   Measurements: 3.1 x 1.6 x 2.1 cm = volume: 5 mL. Normal appearance/no adnexal mass.   Left ovary   Measurements: 4.2 x 2.7 x 2.2 cm = volume: 13 mL. Normal appearance/no adnexal mass.   Pulsed Doppler evaluation of both ovaries demonstrates normal low-resistance arterial and venous waveforms.   Other findings   No abnormal free fluid.   IMPRESSION: 1. Normal pelvic sonogram.     Electronically Signed   By: Phylis Breeding.D.  On: 02/26/2024 21:31     Patient's last menstrual period was 03/18/2024 (exact date).     Body mass index is 29.35 kg/m.     04/12/2024   10:04 AM  Depression screen PHQ 2/9  Decreased Interest 0  Down, Depressed, Hopeless 0  PHQ - 2 Score 0    Blood pressure 110/64, pulse 88, height 5' 5 (1.651 m), weight 176 lb 6.4 oz (80 kg), last menstrual period 03/18/2024, SpO2 100%.  No results found for: DIAGPAP, HPVHIGH, ADEQPAP  GYN HISTORY: No results found for: DIAGPAP, HPVHIGH, ADEQPAP  OB History  Gravida Para  Term Preterm AB Living  3 3 2 1  3   SAB IAB Ectopic Multiple Live Births      3    # Outcome Date GA Lbr Len/2nd Weight Sex Type Anes PTL Lv  3 Term 12/07/11 [redacted]w[redacted]d 06:58 7 lb 3 oz (3.26 kg) M Vag-Spont None  LIV  2 Preterm           1 Term             Past Medical History:  Diagnosis Date   Anemia    Anxiety    Chronic headaches    Depression    IBS (irritable bowel syndrome)    Kidney stones    Kidney stones    Migraines    No pertinent past medical history    Radial fracture    L   Renal disorder    renal lithiasis   Sacral fracture (HCC)    UC (ulcerative colitis) (HCC)    Ulna fracture    L    Past Surgical History:  Procedure Laterality Date   arm surgery Left 1992   Ulnar    Current Outpatient Medications on File Prior to Visit  Medication Sig Dispense Refill   Cholecalciferol (VITAMIN D3) 1000 units CAPS Take 1,000 Units by mouth daily.     Cyanocobalamin (B-12 PO) Place under the tongue.     fluticasone (FLONASE) 50 MCG/ACT nasal spray Place 1 spray into both nostrils daily.     hyoscyamine  (LEVSIN  SL) 0.125 MG SL tablet Place 1 tablet (0.125 mg total) under the tongue every 6 (six) hours as needed. 30 tablet 2   ibuprofen  (ADVIL ) 800 MG tablet Take 1 tablet (800 mg total) by mouth every 8 (eight) hours as needed. 30 tablet 1   loratadine (CLARITIN) 10 MG tablet Take by mouth daily as needed.     oxyCODONE  (ROXICODONE ) 5 MG immediate release tablet Take 1 tablet (5 mg total) by mouth every 4 (four) hours as needed for severe pain (pain score 7-10). 8 tablet 0   progesterone  (PROMETRIUM ) 100 MG capsule TAKE 1 CAPSULE (100 MG TOTAL) BY MOUTH DAILY. MAKE TAKE 2 TABLETS AT BEDTIME WITH PAIN OR BLEEDING 90 capsule 1   sertraline (ZOLOFT) 100 MG tablet Take 100 mg by mouth every morning.     tretinoin (RETIN-A) 0.025 % cream SMARTSIG:1 Application Topical Every Evening     Ubrogepant (UBRELVY) 100 MG TABS Take by mouth.     No current facility-administered  medications on file prior to visit.    Social History   Socioeconomic History   Marital status: Married    Spouse name: Not on file   Number of children: 3   Years of education: Not on file   Highest education level: Not on file  Occupational History   Occupation: teacher  Tobacco Use   Smoking status: Never   Smokeless  tobacco: Never  Vaping Use   Vaping status: Never Used  Substance and Sexual Activity   Alcohol use: Yes    Comment: occasional   Drug use: No   Sexual activity: Yes    Partners: Male    Birth control/protection: None  Other Topics Concern   Not on file  Social History Narrative   Not on file   Social Drivers of Health   Financial Resource Strain: Not on file  Food Insecurity: Not on file  Transportation Needs: Not on file  Physical Activity: Not on file  Stress: Not on file  Social Connections: Not on file  Intimate Partner Violence: Not on file    Family History  Problem Relation Age of Onset   Thyroid disease Mother    Hypertension Mother    Diabetes Father        type 2   Colon cancer Paternal Grandmother    Heart disease Paternal Grandfather    Diabetes Daughter        type 1     Allergies  Allergen Reactions   Naltrexone Swelling      Patient's last menstrual period was Patient's last menstrual period was 03/18/2024 (exact date)..             Review of Systems Alls systems reviewed and are negative.     Physical Exam Constitutional:      Appearance: Normal appearance.  Genitourinary:     Vulva and urethral meatus normal.     No lesions in the vagina.     Genitourinary Comments: Evaluated with PUS     Right Labia: No rash, lesions or skin changes.    Left Labia: No lesions, skin changes or rash.    No vaginal discharge or tenderness.     No vaginal prolapse present.    No vaginal atrophy present.     Right Adnexa: not tender, not palpable and no mass present.    Left Adnexa: not tender, not palpable and no mass  present.    No cervical motion tenderness or discharge.     Uterus is tender and irregular.     Uterus is not enlarged.  Breasts:    Right: Normal.     Left: Normal.  HENT:     Head: Normocephalic.  Neck:     Thyroid: No thyroid mass, thyromegaly or thyroid tenderness.   Cardiovascular:     Rate and Rhythm: Normal rate and regular rhythm.     Heart sounds: Normal heart sounds, S1 normal and S2 normal.  Pulmonary:     Effort: Pulmonary effort is normal.     Breath sounds: Normal breath sounds and air entry.  Abdominal:     General: There is no distension.     Palpations: Abdomen is soft. There is no mass.     Tenderness: There is no abdominal tenderness. There is no guarding or rebound.   Musculoskeletal:        General: Normal range of motion.     Cervical back: Full passive range of motion without pain, normal range of motion and neck supple. No tenderness.     Right lower leg: No edema.     Left lower leg: No edema.   Neurological:     Mental Status: She is alert.   Skin:    General: Skin is warm.   Psychiatric:        Mood and Affect: Mood normal.        Behavior: Behavior normal.  Thought Content: Thought content normal.  Vitals and nursing note reviewed. Exam conducted with a chaperone present.       A:        DUB, likely adenomyosis for annual exam before [facility] in August                             P:      Pap smear collected with nuswab for improved cuff healing after RLH Counseled on healthy lifestyle practices Counseled on annual MMG and importance RTC for preop and for yearly exams  No follow-ups on file.  Reinaldo Caras

## 2024-04-13 LAB — SURESWAB® ADVANCED VAGINITIS PLUS,TMA
C. trachomatis RNA, TMA: NOT DETECTED
CANDIDA SPECIES: NOT DETECTED
Candida glabrata: NOT DETECTED
N. gonorrhoeae RNA, TMA: NOT DETECTED
SURESWAB(R) ADV BACTERIAL VAGINOSIS(BV),TMA: NEGATIVE
TRICHOMONAS VAGINALIS (TV),TMA: NOT DETECTED

## 2024-04-15 ENCOUNTER — Ambulatory Visit: Payer: Self-pay | Admitting: Obstetrics and Gynecology

## 2024-04-17 LAB — CYTOLOGY - PAP: Diagnosis: NEGATIVE

## 2024-04-18 ENCOUNTER — Ambulatory Visit: Admitting: Obstetrics and Gynecology

## 2024-05-22 ENCOUNTER — Ambulatory Visit (INDEPENDENT_AMBULATORY_CARE_PROVIDER_SITE_OTHER): Admitting: Obstetrics and Gynecology

## 2024-05-22 ENCOUNTER — Encounter: Payer: Self-pay | Admitting: Obstetrics and Gynecology

## 2024-05-22 VITALS — BP 124/82 | HR 83 | Ht 65.0 in | Wt 183.0 lb

## 2024-05-22 DIAGNOSIS — R102 Pelvic and perineal pain: Secondary | ICD-10-CM

## 2024-05-22 DIAGNOSIS — N8003 Adenomyosis of the uterus: Secondary | ICD-10-CM

## 2024-05-22 DIAGNOSIS — N946 Dysmenorrhea, unspecified: Secondary | ICD-10-CM | POA: Diagnosis not present

## 2024-05-22 DIAGNOSIS — N921 Excessive and frequent menstruation with irregular cycle: Secondary | ICD-10-CM

## 2024-05-22 DIAGNOSIS — Z01818 Encounter for other preprocedural examination: Secondary | ICD-10-CM

## 2024-05-22 MED ORDER — IBUPROFEN 800 MG PO TABS: 800.0000 mg | ORAL_TABLET | Freq: Three times a day (TID) | ORAL | 1 refills | Status: AC | PRN

## 2024-05-22 MED ORDER — IBUPROFEN 800 MG PO TABS: 800.0000 mg | ORAL_TABLET | Freq: Three times a day (TID) | ORAL | 1 refills | Status: DC | PRN

## 2024-05-22 MED ORDER — METOCLOPRAMIDE HCL 10 MG PO TABS: 10.0000 mg | ORAL_TABLET | Freq: Three times a day (TID) | ORAL | 0 refills | Status: AC | PRN

## 2024-05-22 MED ORDER — OXYCODONE HCL 5 MG PO TABS: 5.0000 mg | ORAL_TABLET | ORAL | 0 refills | Status: AC | PRN

## 2024-05-22 NOTE — H&P (View-Only) (Signed)
 PREOP H&P for [facility], bilateral salpingectomy, possible excision of endometriosis lesions if seen, cystoscopy 48 y.o. y.o. female here for annual exam. Patient's last menstrual period was 05/07/2024 (approximate). Period Pattern: (!) Irregular (2 periods a month) Menstrual Flow: Moderate Menstrual Control: Tampon, Maxi pad Dysmenorrhea: (!) Moderate Dysmenorrhea Symptoms: Cramping  female here for ER follow-up for menorrhagia and pelvic pain.   Bled for 17 days and went to the ER.  Started on lo/loestrin and the bleeding decreased but still having spotting and 3 weeks into the pack. Was using a pad and tampon q hour before ocps.  She started thge ocp's and then had severe LLQ pain.  Denies it was similar to pain she has had with her colon. She has IBS and UC and can tell what those flares feel like. She is a g3p3 and does not desire more children Last pap smear was 2 years ago. She has had anemia in the past 02/26/24 hg 13 She has been back to the ER for pain and reports the pain is worse with a full bladder and a few days before she has spotting or a period.  If her bowels are full as well, she will have more pain during this. Her cycles are all over.  She does not want to try an IUD. 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 She is alternating tylenol  and motrin  and still having pain. She denies any dysuria and has had kidney stones in the past and does not feel this is the same pain. She has completed a colonoscopy in 2024 and they could not contribute her pain to her bowels. She is frustrated with pain and irregular bleeding and would like to move forward with the [facility].  She is bent over in pain and in tears when it comes. There is no height or weight on file to calculate BMI.   Date: 02/26/2024 Department: [facility] Emergency Department at [facility] Released By/Authorizing: Edie, Britni A, PA-C  (auto-released)   Exam Status  Status  Final [99]   PACS Intelerad Image Link   Show images for US  PELVIC COMPLETE W TRANSVAGINAL AND TORSION R/O Study Result  Narrative & Impression  CLINICAL DATA:  Left lower quadrant pelvic pain.   EXAM: TRANSABDOMINAL AND TRANSVAGINAL ULTRASOUND OF PELVIS   DOPPLER ULTRASOUND OF OVARIES   TECHNIQUE: Both transabdominal and transvaginal ultrasound examinations of the pelvis were performed. Transabdominal technique was performed for global imaging of the pelvis including uterus, ovaries, adnexal regions, and pelvic cul-de-sac.   It was necessary to proceed with endovaginal exam following the transabdominal exam to visualize the ovaries bilaterally. Color and duplex Doppler ultrasound was utilized to evaluate blood flow to the ovaries.   COMPARISON:  None Available.   FINDINGS: Uterus   Measurements: 8.9 x 4.7 x 5.0 cm = volume: 109 mL. The uterus is anteverted. The cervix is unremarkable. No intrauterine masses are visualized.   Endometrium   Thickness: 8 mm.  No focal abnormality visualized.   Right ovary   Measurements: 3.1 x 1.6 x 2.1 cm = volume: 5 mL. Normal appearance/no adnexal mass.   Left ovary   Measurements: 4.2 x 2.7 x 2.2 cm = volume: 13 mL. Normal appearance/no adnexal mass.   Pulsed Doppler evaluation of both ovaries demonstrates normal low-resistance arterial and venous waveforms.   Other findings   No abnormal free fluid.   IMPRESSION: 1. Normal pelvic  sonogram.     Electronically Signed   By: Dorethia Molt M.D.   On: 02/26/2024 21:31   Last menstrual period 05/07/2024.     Component Value Date/Time   DIAGPAP  04/12/2024 1017    - Negative for intraepithelial lesion or malignancy (NILM)   ADEQPAP  04/12/2024 1017    Satisfactory for evaluation; transformation zone component PRESENT.    GYN HISTORY:    Component Value Date/Time   DIAGPAP  04/12/2024 1017    - Negative for  intraepithelial lesion or malignancy (NILM)   ADEQPAP  04/12/2024 1017    Satisfactory for evaluation; transformation zone component PRESENT.    OB History  Gravida Para Term Preterm AB Living  3 3 2 1  3   SAB IAB Ectopic Multiple Live Births      3    # Outcome Date GA Lbr Len/2nd Weight Sex Type Anes PTL Lv  3 Term 12/07/11 [redacted]w[redacted]d 06:58 7 lb 3 oz (3.26 kg) M Vag-Spont None  LIV  2 Preterm           1 Term             Past Medical History:  Diagnosis Date   Anemia    Anxiety    Chronic headaches    Depression    IBS (irritable bowel syndrome)    Kidney stones    Kidney stones    Migraines    No pertinent past medical history    Radial fracture    L   Renal disorder    renal lithiasis   Sacral fracture (HCC)    UC (ulcerative colitis) (HCC)    Ulna fracture    L    Past Surgical History:  Procedure Laterality Date   arm surgery Left 1992   Ulnar    Current Outpatient Medications on File Prior to Visit  Medication Sig Dispense Refill   Cholecalciferol (VITAMIN D3) 1000 units CAPS Take 1,000 Units by mouth daily.     Cyanocobalamin (B-12 PO) Place under the tongue.     fluticasone (FLONASE) 50 MCG/ACT nasal spray Place 1 spray into both nostrils daily.     hyoscyamine  (LEVSIN  SL) 0.125 MG SL tablet Place 1 tablet (0.125 mg total) under the tongue every 6 (six) hours as needed. 30 tablet 2   loratadine (CLARITIN) 10 MG tablet Take by mouth daily as needed.     progesterone  (PROMETRIUM ) 100 MG capsule TAKE 1 CAPSULE (100 MG TOTAL) BY MOUTH DAILY. MAKE TAKE 2 TABLETS AT BEDTIME WITH PAIN OR BLEEDING 90 capsule 1   sertraline (ZOLOFT) 100 MG tablet Take 100 mg by mouth every morning.     tretinoin (RETIN-A) 0.025 % cream SMARTSIG:1 Application Topical Every Evening     Ubrogepant (UBRELVY) 100 MG TABS Take by mouth.     No current facility-administered medications on file prior to visit.    Social History   Socioeconomic History   Marital status: Married     Spouse name: Not on file   Number of children: 3   Years of education: Not on file   Highest education level: Not on file  Occupational History   Occupation: teacher  Tobacco Use   Smoking status: Never   Smokeless tobacco: Never  Vaping Use   Vaping status: Never Used  Substance and Sexual Activity   Alcohol use: Yes    Comment: occasional   Drug use: No   Sexual activity: Yes    Partners: Male  Birth control/protection: None  Other Topics Concern   Not on file  Social History Narrative   Not on file   Social Drivers of Health   Financial Resource Strain: Not on file  Food Insecurity: Not on file  Transportation Needs: Not on file  Physical Activity: Not on file  Stress: Not on file  Social Connections: Not on file  Intimate Partner Violence: Not on file    Family History  Problem Relation Age of Onset   Thyroid disease Mother    Hypertension Mother    Diabetes Father        type 2   Colon cancer Paternal Grandmother    Heart disease Paternal Grandfather    Diabetes Daughter        type 1     Allergies  Allergen Reactions   Naltrexone Swelling      Patient's last menstrual period was Patient's last menstrual period was 05/07/2024 (approximate)..            Review of Systems Alls systems reviewed and are negative.   S1S2 CTA B/L Soft, NT ND No c/c/e   A:   DUB, likely adenomyosis for preop for RLH                             P:        The risk, benefits, alternatives of the procedure were discussed with patient including but not limited to the risk for bleeding, infection, injury to surrounding structures such as the bowel, vessels, bladder, and ureters were reviewed.  The risk for blood products and risk for an additional procedure, and risk for laparotomy in an extreme event was discussed.  The risk for blood clots are also discussed.  Postoperative care instructions were discussed and reviewed with patient.  Postoperative medications were  sent to her pharmacy and patient was instructed to pick up prior to surgery.  She will have a driver to take her there and take her home and she agrees that that person will stay with her overnight. Post care instructions were also put in the wrap-up section of this note for patient, as a reminder of care instructions. All questions were answered and patient agrees and would like to proceed with the procedure. Discussed she can stop Prometrium  on the day of surgery.   30 minutes spent on reviewing records, imaging,  and one on one patient time and counseling patient and documentation Dr. Glennon  No follow-ups on file.  Cathy Turner

## 2024-05-22 NOTE — Patient Instructions (Signed)
 Robotic Laparoscopic Hysterectomy, Care After  The following information offers guidance on how to care for yourself after your procedure. Your health care provider may also give you more specific instructions. If you have problems or questions, contact your health care provider. What can I expect after the procedure? After the procedure, it is common to have: Pain, bruising, and numbness around your incisions. Tiredness (fatigue). Poor appetite. Less interest in sex. Vaginal discharge or bleeding. You will need to use a sanitary pad after this procedure.  HEAVY BLEEDING LIKE A PERIOD IS NOT NORMAL.  PLEASE CALL YOUR PROVIDER IF SOAKING A PAD. Feelings of sadness or other emotions. If your ovaries were also removed, it is also common to have symptoms of menopause, such as hot flashes, night sweats, and lack of sleep (insomnia).  Ovaries should stay in if at all possible until at least the age of 30. Follow these instructions at home: Medicines Take over-the-counter and prescription medicines only as told by your health care provider. Ask your health care provider if the medicine prescribed to you: Requires you to avoid driving or using machinery. Can cause constipation. You may need to take these actions to prevent or treat constipation: Drink enough fluid to keep your urine pale yellow. Take over-the-counter or prescription medicines. Eat foods that are high in fiber, such as beans, whole grains, and fresh fruits and vegetables. Limit foods that are high in fat and processed sugars, such as fried or sweet foods.  Also, avoid spicy foods.  NAUSEA IS COMMON THE FIRST NIGHT OF SURGERY.  IF IT LASTS BEYOND 24 HOURS, CALL YOUR PROVIDER.  NAUSEA MEDICATION WAS GIVEN AT YOUR PREOP APPOINTMENT THAT YOU CAN TAKE AFTER SURGERY. Incision care  Follow instructions from your health care provider about how to take care of your incisions. Make sure you: LEAVE INCISION OPEN AND DRY-NO BANDAGES Leave  stitches (sutures), skin glue, or adhesive strips in place UNTIL 2 WEEKS THEN REMOVE IN THE SHOWER.  If adhesive strip edges start to loosen and curl up, you may trim the loose edges. Check your incision areas every day for signs of infection. Check for: More redness, swelling, or pain. Fluid or blood. Warmth. Pus or a bad smell. Activity  Rest as told by your health care provider. Avoid sitting for a long time without moving. Get up to take short walks every 1-2 hours. This is important to improve blood flow and breathing. Ask for help if you feel weak or unsteady. Return to your normal activities as told by your health care provider. Ask your health care provider what activities are safe for you. Do not lift anything that is heavier than 10 lb (4.5 kg), or the limit that you are told, for 8 WEEKS after surgery or until your health care provider says that it is safe. If you were given a sedative during the procedure, it can affect you for several hours. Do not drive or operate machinery until your health care provider says that it is safe. Lifestyle Do not use any products that contain nicotine or tobacco. These products include cigarettes, chewing tobacco, and vaping devices, such as e-cigarettes. These can delay healing after surgery. If you need help quitting, ask your health care provider. Do not drink alcohol until your health care provider approves. General instructions FOR 2 WEEKS AFTER SURGERY, THEN YOU MAY USE TUBS AND HOT TUBS Do not douche, use tampons, or have sex for at least 10 weeks, or as told by your health care  provider. If you struggle with physical or emotional changes after your procedure, speak with your health care provider or a therapist. Do not take baths, swim, or use a hot tub until your health care provider approves. You may only be allowed to take showers for 2 weeks. IF YOU HAVE BURNING WITH URINATION, PLEASE CALL YOUR DOCTOR. BLADDER INFECTIONS MAY OCCUR AFTER  SURGERY Try to have someone at home with you for the first 1-2 weeks to help with your daily chores. Wear compression stockings as told by your health care provider. These stockings help to prevent blood clots and reduce swelling in your legs. Keep all follow-up visits. This is important. Contact a health care provider if: You have any of these signs of infection: Chills or a fever 168f OR GREATER. More redness, swelling, or pain around an incision. Fluid or blood coming from an incision. Warmth coming from an incision. Pus or a bad smell coming from an incision. Burning with urination. Urinary frequency or cramping.   IF YOU HAVE THESE SYMPTOMS, PLEASE CALL THE OFFICE TO COME EVALUATE FOR A BLADDER INFECTION AT [phone] An incision opens. You feel dizzy or light-headed. You have pain or bleeding when you urinate, or you are unable to urinate. You have abnormal vaginal discharge. You have pain that does not get better with medicine. Get help right away if: You have a fever and your symptoms suddenly get worse. You have severe abdominal pain. You have chest pain or shortness of breath. You may have chest pain and shortness of breath from the CO2 gas for a few days after surgery.  This is very common.  Walking, Gas-X and motrin  will usually help relieve this discomfort You faint. You have pain, swelling, or redness in your leg. You have heavy vaginal bleeding with blood clots, soaking through a sanitary pad in less than 1 hour. These symptoms may represent a serious problem that is an emergency. Do not wait to see if the symptoms will go away. Get medical help right away. Call your local emergency services (911 in the [place] Do not drive yourself to the hospital. Summary  CONSTIPATION MEDICATION AFTER SURGERY: COLACE, MOM, MIRALAX, GAS X are all helpful to have on hand, if needed.  FILL ALL POSTOP MEDICATION BEFORE SURGERY  After the procedure, it is common to have pain and bruising  around your incisions. Do not take baths, swim, or use a hot tub until your health care provider approves. Do not lift anything that is heavier than 13 lb (4.5 kg), or the limit that you are told, for one month after surgery or until your health care provider says that it is safe. Tell your health care provider if you have any signs or symptoms of infection after the procedure. Get help right away if you have severe abdominal pain, chest pain, shortness of breath, or heavy bleeding from your vagina. This information is not intended to replace advice given to you by your  health care provider. Make sure you discuss any questions you have with your health care provider. Document Revised: 06/18/2020 Document Reviewed: 06/19/2020 Elsevier Patient Education  2024 [employer].

## 2024-05-22 NOTE — Progress Notes (Unsigned)
 PREOP H&P for [facility], bilateral salpingectomy, possible excision of endometriosis lesions if seen, cystoscopy 48 y.o. y.o. female here for annual exam. Patient's last menstrual period was 05/07/2024 (approximate). Period Pattern: (!) Irregular (2 periods a month) Menstrual Flow: Moderate Menstrual Control: Tampon, Maxi pad Dysmenorrhea: (!) Moderate Dysmenorrhea Symptoms: Cramping  female here for ER follow-up for menorrhagia and pelvic pain.   Bled for 17 days and went to the ER.  Started on lo/loestrin and the bleeding decreased but still having spotting and 3 weeks into the pack. Was using a pad and tampon q hour before ocps.  She started thge ocp's and then had severe LLQ pain.  Denies it was similar to pain she has had with her colon. She has IBS and UC and can tell what those flares feel like. She is a g3p3 and does not desire more children Last pap smear was 2 years ago. She has had anemia in the past 02/26/24 hg 13 She has been back to the ER for pain and reports the pain is worse with a full bladder and a few days before she has spotting or a period.  If her bowels are full as well, she will have more pain during this. Her cycles are all over.  She does not want to try an IUD. 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 She is alternating tylenol  and motrin  and still having pain. She denies any dysuria and has had kidney stones in the past and does not feel this is the same pain. She has completed a colonoscopy in 2024 and they could not contribute her pain to her bowels. She is frustrated with pain and irregular bleeding and would like to move forward with the [facility].  She is bent over in pain and in tears when it comes. There is no height or weight on file to calculate BMI.   Date: 02/26/2024 Department: [facility] Emergency Department at [facility] Released By/Authorizing: Edie, Britni A, PA-C  (auto-released)   Exam Status  Status  Final [99]   PACS Intelerad Image Link   Show images for US  PELVIC COMPLETE W TRANSVAGINAL AND TORSION R/O Study Result  Narrative & Impression  CLINICAL DATA:  Left lower quadrant pelvic pain.   EXAM: TRANSABDOMINAL AND TRANSVAGINAL ULTRASOUND OF PELVIS   DOPPLER ULTRASOUND OF OVARIES   TECHNIQUE: Both transabdominal and transvaginal ultrasound examinations of the pelvis were performed. Transabdominal technique was performed for global imaging of the pelvis including uterus, ovaries, adnexal regions, and pelvic cul-de-sac.   It was necessary to proceed with endovaginal exam following the transabdominal exam to visualize the ovaries bilaterally. Color and duplex Doppler ultrasound was utilized to evaluate blood flow to the ovaries.   COMPARISON:  None Available.   FINDINGS: Uterus   Measurements: 8.9 x 4.7 x 5.0 cm = volume: 109 mL. The uterus is anteverted. The cervix is unremarkable. No intrauterine masses are visualized.   Endometrium   Thickness: 8 mm.  No focal abnormality visualized.   Right ovary   Measurements: 3.1 x 1.6 x 2.1 cm = volume: 5 mL. Normal appearance/no adnexal mass.   Left ovary   Measurements: 4.2 x 2.7 x 2.2 cm = volume: 13 mL. Normal appearance/no adnexal mass.   Pulsed Doppler evaluation of both ovaries demonstrates normal low-resistance arterial and venous waveforms.   Other findings   No abnormal free fluid.   IMPRESSION: 1. Normal pelvic  sonogram.     Electronically Signed   By: Dorethia Molt M.D.   On: 02/26/2024 21:31   Last menstrual period 05/07/2024.     Component Value Date/Time   DIAGPAP  04/12/2024 1017    - Negative for intraepithelial lesion or malignancy (NILM)   ADEQPAP  04/12/2024 1017    Satisfactory for evaluation; transformation zone component PRESENT.    GYN HISTORY:    Component Value Date/Time   DIAGPAP  04/12/2024 1017    - Negative for  intraepithelial lesion or malignancy (NILM)   ADEQPAP  04/12/2024 1017    Satisfactory for evaluation; transformation zone component PRESENT.    OB History  Gravida Para Term Preterm AB Living  3 3 2 1  3   SAB IAB Ectopic Multiple Live Births      3    # Outcome Date GA Lbr Len/2nd Weight Sex Type Anes PTL Lv  3 Term 12/07/11 [redacted]w[redacted]d 06:58 7 lb 3 oz (3.26 kg) M Vag-Spont None  LIV  2 Preterm           1 Term             Past Medical History:  Diagnosis Date   Anemia    Anxiety    Chronic headaches    Depression    IBS (irritable bowel syndrome)    Kidney stones    Kidney stones    Migraines    No pertinent past medical history    Radial fracture    L   Renal disorder    renal lithiasis   Sacral fracture (HCC)    UC (ulcerative colitis) (HCC)    Ulna fracture    L    Past Surgical History:  Procedure Laterality Date   arm surgery Left 1992   Ulnar    Current Outpatient Medications on File Prior to Visit  Medication Sig Dispense Refill   Cholecalciferol (VITAMIN D3) 1000 units CAPS Take 1,000 Units by mouth daily.     Cyanocobalamin (B-12 PO) Place under the tongue.     fluticasone (FLONASE) 50 MCG/ACT nasal spray Place 1 spray into both nostrils daily.     hyoscyamine  (LEVSIN  SL) 0.125 MG SL tablet Place 1 tablet (0.125 mg total) under the tongue every 6 (six) hours as needed. 30 tablet 2   loratadine (CLARITIN) 10 MG tablet Take by mouth daily as needed.     progesterone  (PROMETRIUM ) 100 MG capsule TAKE 1 CAPSULE (100 MG TOTAL) BY MOUTH DAILY. MAKE TAKE 2 TABLETS AT BEDTIME WITH PAIN OR BLEEDING 90 capsule 1   sertraline (ZOLOFT) 100 MG tablet Take 100 mg by mouth every morning.     tretinoin (RETIN-A) 0.025 % cream SMARTSIG:1 Application Topical Every Evening     Ubrogepant (UBRELVY) 100 MG TABS Take by mouth.     No current facility-administered medications on file prior to visit.    Social History   Socioeconomic History   Marital status: Married     Spouse name: Not on file   Number of children: 3   Years of education: Not on file   Highest education level: Not on file  Occupational History   Occupation: teacher  Tobacco Use   Smoking status: Never   Smokeless tobacco: Never  Vaping Use   Vaping status: Never Used  Substance and Sexual Activity   Alcohol use: Yes    Comment: occasional   Drug use: No   Sexual activity: Yes    Partners: Male  Birth control/protection: None  Other Topics Concern   Not on file  Social History Narrative   Not on file   Social Drivers of Health   Financial Resource Strain: Not on file  Food Insecurity: Not on file  Transportation Needs: Not on file  Physical Activity: Not on file  Stress: Not on file  Social Connections: Not on file  Intimate Partner Violence: Not on file    Family History  Problem Relation Age of Onset   Thyroid disease Mother    Hypertension Mother    Diabetes Father        type 2   Colon cancer Paternal Grandmother    Heart disease Paternal Grandfather    Diabetes Daughter        type 1     Allergies  Allergen Reactions   Naltrexone Swelling      Patient's last menstrual period was Patient's last menstrual period was 05/07/2024 (approximate)..            Review of Systems Alls systems reviewed and are negative.   S1S2 CTA B/L Soft, NT ND No c/c/e   A:   DUB, likely adenomyosis for preop for RLH                             P:        The risk, benefits, alternatives of the procedure were discussed with patient including but not limited to the risk for bleeding, infection, injury to surrounding structures such as the bowel, vessels, bladder, and ureters were reviewed.  The risk for blood products and risk for an additional procedure, and risk for laparotomy in an extreme event was discussed.  The risk for blood clots are also discussed.  Postoperative care instructions were discussed and reviewed with patient.  Postoperative medications were  sent to her pharmacy and patient was instructed to pick up prior to surgery.  She will have a driver to take her there and take her home and she agrees that that person will stay with her overnight. Post care instructions were also put in the wrap-up section of this note for patient, as a reminder of care instructions. All questions were answered and patient agrees and would like to proceed with the procedure. Discussed she can stop Prometrium  on the day of surgery.   30 minutes spent on reviewing records, imaging,  and one on one patient time and counseling patient and documentation Dr. Glennon  No follow-ups on file.  Cathy Turner

## 2024-05-27 ENCOUNTER — Encounter: Payer: Self-pay | Admitting: *Deleted

## 2024-05-27 ENCOUNTER — Encounter (HOSPITAL_COMMUNITY): Payer: Self-pay | Admitting: Obstetrics and Gynecology

## 2024-05-28 ENCOUNTER — Encounter (HOSPITAL_COMMUNITY): Payer: Self-pay | Admitting: Obstetrics and Gynecology

## 2024-05-28 NOTE — Progress Notes (Signed)
 Spoke w/ via phone for pre-op interview--- pt Lab needs dos----     upt    Lab results------ lab appt 06-03-2024 @ 0900 getting CBC T&S COVID test -----patient states asymptomatic no test needed Arrive at ------- 0800 on 06-05-2024 NPO after MN w/ exception sips of water w/ meds Pre-Surgery Ensure or G2: n/a  Med rec completed Medications to take morning of surgery ----- zoloft /  if needed prn meds Diabetic medication ----- n/a  GLP1 agonist last dose: n/a GLP1 instructions:  Patient instructed no nail polish to be worn day of surgery Patient instructed to bring photo id and insurance card day of surgery Patient aware to have Driver (ride ) / caregiver    for 24 hours after surgery - husband, Comptroller Patient Special Instructions ----- will pick up soap and written instructions at lab appt Pre-Op special Instructions ----- n/a  Patient verbalized understanding of instructions that were given at this phone interview. Patient denies chest pain, sob, fever, cough at the interview.

## 2024-05-28 NOTE — Pre-Procedure Instructions (Signed)
 Surgical Instructions   Your procedure is scheduled on :  Wednesday,  06-05-2024. Report to [facility] Main Entrance A at 8:00  A.M., then check in with the Admitting office. Any questions or running late day of surgery: call [phone]  Questions prior to your surgery date: call [phone], Monday-Friday, 8am-4pm. If you experience any cold or flu symptoms such as cough, fever, chills, shortness of breath, etc. between now and your scheduled surgery, please notify your surgeon office.    Remember:  Do not eat any food and do not drink any liquids after midnight the night before your surgery.  This includes no water,  candy,  gum,  and  mints.    Take these medicines the morning of surgery with A SIPS OF WATER : Sertraline (zoloft)   May take these medicines IF NEEDED: Loratadine (claritin) Fluticasone (flonase) nasal spray) Ubrogepant deadra) Hyoscyamine  (levsin )   One week prior to surgery, STOP taking any Aspirin (unless otherwise instructed by your surgeon) Aleve, Naproxen, Ibuprofen , Motrin , Advil , Goody's, BC's, all herbal medications, fish oil, and non-prescription vitamins.                     Do NOT Smoke (Tobacco/Vaping) and Do Not drink alcohol for 24 hours prior to your procedure.  If you use a CPAP at night, you may bring your mask/headgear for your overnight stay.   You will be asked to remove any contacts, glasses, piercing's, hearing aid's, dentures/partials prior to surgery. Please bring cases for these items if needed.    Patients discharged the day of surgery will not be allowed to drive home, and someone needs to stay with them for 24 hours.  SURGICAL WAITING ROOM VISITATION Patients may have no more than 2 support people in the waiting area - these visitors may rotate.   Pre-op nurse will coordinate an appropriate time for 1 ADULT support person, who may not rotate, to accompany patient in pre-op.  Children under the age of 70 must have an adult  with them who is not the patient and must remain in the main waiting area with an adult.  If the patient needs to stay at the hospital during part of their recovery, the visitor guidelines for inpatient rooms apply.  Please refer to the [facility] website for the visitor guidelines for any additional information.   If you received a COVID test during your pre-op visit  it is requested that you wear a mask when out in public, stay away from anyone that may not be feeling well and notify your surgeon if you develop symptoms. If you have been in contact with anyone that has tested positive in the last 10 days please notify you surgeon.      Pre-operative CHG Bathing Instructions   You can play a key role in reducing the risk of infection after surgery. Your skin needs to be as free of germs as possible. You can reduce the number of germs on your skin by washing with CHG (chlorhexidine gluconate) soap before surgery. CHG is an antiseptic soap that kills germs and continues to kill germs even after washing.   DO NOT use if you have an allergy to chlorhexidine/CHG or antibacterial soaps. If your skin becomes reddened or irritated, stop using the CHG and notify Pre-op nurse day of surgery.             TAKE A SHOWER THE NIGHT BEFORE SURGERY AND THE DAY OF SURGERY    Please  keep in mind the following:  DO NOT shave, including legs and underarms, 48 hours prior to surgery.   You may shave your face before/day of surgery.  Place clean sheets on your bed the night before surgery Use a clean washcloth (not used since being washed) for each shower. DO NOT sleep with pet's night before surgery.  CHG Shower Instructions:  Wash your face and private area with normal soap. If you choose to wash your hair, wash first with your normal shampoo.  After you use shampoo/soap, rinse your hair and body thoroughly to remove shampoo/soap residue.  Turn the water OFF and apply half the bottle of CHG soap to a  CLEAN washcloth.  Apply CHG soap ONLY FROM YOUR NECK DOWN TO YOUR TOES (washing for 3-5 minutes)  DO NOT use CHG soap on face, private areas, open wounds, or sores.  Pay special attention to the area where your surgery is being performed.  If you are having back surgery, having someone wash your back for you may be helpful. Wait 2 minutes after CHG soap is applied, then you may rinse off the CHG soap.  Pat dry with a clean towel  Put on clean pajamas    Additional instructions for the day of surgery: DO NOT APPLY any lotions,  powder,  oils,  deodorants (may use underarm deodorant) , cologne/  perfumes  or makeup Do not wear jewelry / piercing's/  metal/  permanent jewelry must be removed prior to arrival day of surgery.  (No plastic piercing) Do not wear nail polish, gel polish, artificial nails, or any other type of covering on natural finger nails (toe nails are okay) Do not bring valuables to the hospital. [facility] is not responsible for valuables/personal belongings. Put on clean/comfortable clothes.  Please brush your teeth.  Ask your nurse before applying any prescription medications to the skin.

## 2024-06-03 ENCOUNTER — Ambulatory Visit (HOSPITAL_BASED_OUTPATIENT_CLINIC_OR_DEPARTMENT_OTHER): Payer: Self-pay | Admitting: Obstetrics and Gynecology

## 2024-06-03 ENCOUNTER — Encounter (HOSPITAL_COMMUNITY)
Admission: RE | Admit: 2024-06-03 | Discharge: 2024-06-03 | Disposition: A | Discharge status: Home / Self Care | Source: Ambulatory Visit | Attending: Obstetrics and Gynecology | Admitting: Obstetrics and Gynecology

## 2024-06-03 DIAGNOSIS — Z01812 Encounter for preprocedural laboratory examination: Secondary | ICD-10-CM | POA: Diagnosis present

## 2024-06-03 DIAGNOSIS — Z01818 Encounter for other preprocedural examination: Secondary | ICD-10-CM

## 2024-06-03 LAB — CBC
HCT: 40.7 % (ref 36.0–46.0)
Hemoglobin: 13.2 g/dL (ref 12.0–15.0)
MCH: 29.7 pg (ref 26.0–34.0)
MCHC: 32.4 g/dL (ref 30.0–36.0)
MCV: 91.7 fL (ref 80.0–100.0)
Platelets: 249 K/uL (ref 150–400)
RBC: 4.44 MIL/uL (ref 3.87–5.11)
RDW: 14.4 % (ref 11.5–15.5)
WBC: 6.3 K/uL (ref 4.0–10.5)
nRBC: 0 % (ref 0.0–0.2)

## 2024-06-03 LAB — TYPE AND SCREEN
ABO/RH(D): O POS
Antibody Screen: NEGATIVE

## 2024-06-04 NOTE — Anesthesia Preprocedure Evaluation (Signed)
 Anesthesia Evaluation  Patient identified by MRN, date of birth, ID band Patient awake    Reviewed: Allergy & Precautions, NPO status , Patient's Chart, lab work & pertinent test results  History of Anesthesia Complications Negative for: history of anesthetic complications  Airway Mallampati: I  TM Distance: >3 FB Neck ROM: Full    Dental no notable dental hx. (+) Teeth Intact, Dental Advisory Given   Pulmonary neg pulmonary ROS   Pulmonary exam normal breath sounds clear to auscultation       Cardiovascular (-) hypertension(-) angina (-) Past MI Normal cardiovascular exam Rhythm:Regular Rate:Normal     Neuro/Psych  Headaches PSYCHIATRIC DISORDERS Anxiety Depression       GI/Hepatic negative GI ROS, Neg liver ROS,,,  Endo/Other    Renal/GU negative Renal ROS     Musculoskeletal negative musculoskeletal ROS (+)    Abdominal   Peds  Hematology Lab Results      Component                Value               Date                      WBC                      6.3                 06/03/2024                HGB                      13.2                06/03/2024                HCT                      40.7                06/03/2024                MCV                      91.7                06/03/2024                PLT                      249                 06/03/2024              Anesthesia Other Findings All: naltrexone, latex  Reproductive/Obstetrics negative OB ROS                              Anesthesia Physical Anesthesia Plan  ASA: 2  Anesthesia Plan: General   Post-op Pain Management: Lidocaine  infusion*, Ketamine  IV*, Toradol  IV (intra-op)* and Tylenol  PO (pre-op)*   Induction:   PONV Risk Score and Plan: 4 or greater and Treatment may vary due to age or medical condition, Midazolam , Dexamethasone  and Ondansetron   Airway Management Planned: Oral ETT  Additional Equipment:  None  Intra-op Plan:   Post-operative Plan: Extubation in OR  Informed Consent: I have reviewed the patients History and Physical, chart, labs and discussed the procedure including the risks, benefits and alternatives for the proposed anesthesia with the patient or authorized representative who has indicated his/her understanding and acceptance.     Dental advisory given  Plan Discussed with: CRNA and Surgeon  Anesthesia Plan Comments:          Anesthesia Quick Evaluation

## 2024-06-05 ENCOUNTER — Encounter (HOSPITAL_COMMUNITY): Payer: Self-pay | Admitting: Obstetrics and Gynecology

## 2024-06-05 ENCOUNTER — Other Ambulatory Visit: Payer: Self-pay

## 2024-06-05 ENCOUNTER — Ambulatory Visit (HOSPITAL_BASED_OUTPATIENT_CLINIC_OR_DEPARTMENT_OTHER): Admitting: Anesthesiology

## 2024-06-05 ENCOUNTER — Encounter (HOSPITAL_COMMUNITY)
Admission: RE | Disposition: A | Discharge status: Home / Self Care | Payer: Self-pay | Source: Home / Self Care | Attending: Obstetrics and Gynecology

## 2024-06-05 ENCOUNTER — Ambulatory Visit (HOSPITAL_COMMUNITY): Admitting: Anesthesiology

## 2024-06-05 ENCOUNTER — Ambulatory Visit (HOSPITAL_COMMUNITY)
Admission: RE | Admit: 2024-06-05 | Discharge: 2024-06-05 | Disposition: A | Discharge status: Home / Self Care | Attending: Obstetrics and Gynecology | Admitting: Obstetrics and Gynecology

## 2024-06-05 DIAGNOSIS — R102 Pelvic and perineal pain: Secondary | ICD-10-CM | POA: Diagnosis present

## 2024-06-05 DIAGNOSIS — D259 Leiomyoma of uterus, unspecified: Secondary | ICD-10-CM | POA: Insufficient documentation

## 2024-06-05 DIAGNOSIS — N946 Dysmenorrhea, unspecified: Secondary | ICD-10-CM | POA: Insufficient documentation

## 2024-06-05 DIAGNOSIS — D649 Anemia, unspecified: Secondary | ICD-10-CM | POA: Insufficient documentation

## 2024-06-05 DIAGNOSIS — F418 Other specified anxiety disorders: Secondary | ICD-10-CM | POA: Insufficient documentation

## 2024-06-05 DIAGNOSIS — N8003 Adenomyosis of the uterus: Secondary | ICD-10-CM | POA: Diagnosis present

## 2024-06-05 DIAGNOSIS — N72 Inflammatory disease of cervix uteri: Secondary | ICD-10-CM | POA: Insufficient documentation

## 2024-06-05 DIAGNOSIS — N921 Excessive and frequent menstruation with irregular cycle: Secondary | ICD-10-CM | POA: Diagnosis not present

## 2024-06-05 DIAGNOSIS — Z01818 Encounter for other preprocedural examination: Secondary | ICD-10-CM

## 2024-06-05 HISTORY — DX: Unspecified hemorrhoids: K64.9

## 2024-06-05 HISTORY — DX: Irritable bowel syndrome with diarrhea: K58.0

## 2024-06-05 HISTORY — DX: Presence of spectacles and contact lenses: Z97.3

## 2024-06-05 HISTORY — DX: Adenomyosis of the uterus: N80.03

## 2024-06-05 HISTORY — DX: Dysmenorrhea, unspecified: N94.6

## 2024-06-05 HISTORY — DX: Personal history of urinary calculi: Z87.442

## 2024-06-05 HISTORY — PX: ROBOTIC ASSISTED TOTAL HYSTERECTOMY WITH BILATERAL SALPINGO OOPHERECTOMY: SHX6086

## 2024-06-05 HISTORY — DX: Pelvic and perineal pain: R10.2

## 2024-06-05 HISTORY — DX: Other seasonal allergic rhinitis: J30.2

## 2024-06-05 HISTORY — DX: Personal history of adenomatous and serrated colon polyps: Z86.0101

## 2024-06-05 HISTORY — DX: Family history of other specified conditions: Z84.89

## 2024-06-05 HISTORY — PX: CYSTOSCOPY: SHX5120

## 2024-06-05 HISTORY — DX: Excessive and frequent menstruation with irregular cycle: N92.1

## 2024-06-05 LAB — POCT PREGNANCY, URINE: Preg Test, Ur: NEGATIVE

## 2024-06-05 SURGERY — HYSTERECTOMY, TOTAL, ROBOT-ASSISTED, LAPAROSCOPIC, WITH BILATERAL SALPINGO-OOPHORECTOMY
Anesthesia: General | Site: Bladder

## 2024-06-05 MED ORDER — LACTATED RINGERS IV SOLN: INTRAVENOUS | Status: DC

## 2024-06-05 MED ORDER — BUPIVACAINE HCL (PF) 0.5 % IJ SOLN
INTRAMUSCULAR | Status: DC | PRN
  Administered 2024-06-05: 14 mL

## 2024-06-05 MED ORDER — HYDROMORPHONE HCL 1 MG/ML IJ SOLN
INTRAMUSCULAR | Status: AC
  Filled 2024-06-05: qty 0.5

## 2024-06-05 MED ORDER — ONDANSETRON HCL 4 MG/2ML IJ SOLN
INTRAMUSCULAR | Status: AC
  Filled 2024-06-05: qty 2

## 2024-06-05 MED ORDER — OXYCODONE HCL 5 MG/5ML PO SOLN: 5.0000 mg | Freq: Once | ORAL | Status: DC | PRN

## 2024-06-05 MED ORDER — CHLORHEXIDINE GLUCONATE 0.12 % MT SOLN
15.0000 mL | Freq: Once | OROMUCOSAL | Status: AC
  Administered 2024-06-05: 15 mL via OROMUCOSAL

## 2024-06-05 MED ORDER — HYDROMORPHONE HCL 1 MG/ML IJ SOLN
0.2500 mg | INTRAMUSCULAR | Status: DC | PRN
  Administered 2024-06-05 (×2): 0.5 mg via INTRAVENOUS

## 2024-06-05 MED ORDER — ROCURONIUM BROMIDE 10 MG/ML (PF) SYRINGE
PREFILLED_SYRINGE | INTRAVENOUS | Status: AC
  Filled 2024-06-05: qty 10

## 2024-06-05 MED ORDER — LIDOCAINE IN D5W 4-5 MG/ML-% IV SOLN
INTRAVENOUS | Status: AC
  Filled 2024-06-05: qty 500

## 2024-06-05 MED ORDER — SUGAMMADEX SODIUM 200 MG/2ML IV SOLN
INTRAVENOUS | Status: DC | PRN
Start: 2024-06-05 — End: 2024-06-05
  Administered 2024-06-05: 200 mg via INTRAVENOUS

## 2024-06-05 MED ORDER — LIDOCAINE IN D5W 4-5 MG/ML-% IV SOLN
INTRAVENOUS | Status: DC | PRN
  Administered 2024-06-05: 2.1 mg/min via INTRAVENOUS

## 2024-06-05 MED ORDER — HYDROMORPHONE HCL 1 MG/ML IJ SOLN: 0.2000 mg | INTRAMUSCULAR | Status: DC | PRN

## 2024-06-05 MED ORDER — PHENYLEPHRINE 80 MCG/ML (10ML) SYRINGE FOR IV PUSH (FOR BLOOD PRESSURE SUPPORT)
PREFILLED_SYRINGE | INTRAVENOUS | Status: AC
Start: 2024-06-05 — End: 2024-06-05
  Filled 2024-06-05: qty 10

## 2024-06-05 MED ORDER — MIDAZOLAM HCL 2 MG/2ML IJ SOLN
INTRAMUSCULAR | Status: AC
Start: 2024-06-05 — End: 2024-06-05
  Filled 2024-06-05: qty 2

## 2024-06-05 MED ORDER — PROPOFOL 10 MG/ML IV BOLUS
INTRAVENOUS | Status: DC | PRN
Start: 2024-06-05 — End: 2024-06-05
  Administered 2024-06-05: 200 mg via INTRAVENOUS

## 2024-06-05 MED ORDER — HYDROMORPHONE HCL 1 MG/ML IJ SOLN
INTRAMUSCULAR | Status: AC
Start: 2024-06-05 — End: 2024-06-05
  Filled 2024-06-05: qty 1

## 2024-06-05 MED ORDER — KETOROLAC TROMETHAMINE 30 MG/ML IJ SOLN
INTRAMUSCULAR | Status: AC
  Filled 2024-06-05: qty 1

## 2024-06-05 MED ORDER — ACETAMINOPHEN 500 MG PO TABS
ORAL_TABLET | ORAL | Status: AC
  Filled 2024-06-05: qty 2

## 2024-06-05 MED ORDER — ROCURONIUM BROMIDE 10 MG/ML (PF) SYRINGE
PREFILLED_SYRINGE | INTRAVENOUS | Status: DC | PRN
  Administered 2024-06-05: 60 mg via INTRAVENOUS

## 2024-06-05 MED ORDER — DEXMEDETOMIDINE HCL IN NACL 80 MCG/20ML IV SOLN
INTRAVENOUS | Status: DC | PRN
Start: 2024-06-05 — End: 2024-06-05
  Administered 2024-06-05: 10 ug via INTRAVENOUS

## 2024-06-05 MED ORDER — ACETAMINOPHEN 500 MG PO TABS
1000.0000 mg | ORAL_TABLET | ORAL | Status: AC
  Administered 2024-06-05: 1000 mg via ORAL

## 2024-06-05 MED ORDER — LIDOCAINE 2% (20 MG/ML) 5 ML SYRINGE
INTRAMUSCULAR | Status: AC
  Filled 2024-06-05: qty 5

## 2024-06-05 MED ORDER — ONDANSETRON HCL 4 MG/2ML IJ SOLN
INTRAMUSCULAR | Status: DC | PRN
  Administered 2024-06-05: 4 mg via INTRAVENOUS

## 2024-06-05 MED ORDER — KETAMINE HCL 10 MG/ML IJ SOLN
INTRAMUSCULAR | Status: DC | PRN
  Administered 2024-06-05: 10 mg via INTRAVENOUS

## 2024-06-05 MED ORDER — OXYCODONE HCL 5 MG PO TABS: 5.0000 mg | ORAL_TABLET | ORAL | Status: DC | PRN

## 2024-06-05 MED ORDER — FENTANYL CITRATE (PF) 250 MCG/5ML IJ SOLN
INTRAMUSCULAR | Status: DC | PRN
  Administered 2024-06-05: 100 ug via INTRAVENOUS

## 2024-06-05 MED ORDER — BUPIVACAINE HCL (PF) 0.5 % IJ SOLN
INTRAMUSCULAR | Status: AC
  Filled 2024-06-05: qty 30

## 2024-06-05 MED ORDER — MIDAZOLAM HCL 2 MG/2ML IJ SOLN
INTRAMUSCULAR | Status: DC | PRN
  Administered 2024-06-05: 2 mg via INTRAVENOUS

## 2024-06-05 MED ORDER — ONDANSETRON HCL 4 MG/2ML IJ SOLN: 4.0000 mg | Freq: Four times a day (QID) | INTRAMUSCULAR | Status: DC | PRN

## 2024-06-05 MED ORDER — 0.9 % SODIUM CHLORIDE (POUR BTL) OPTIME
TOPICAL | Status: DC | PRN
  Administered 2024-06-05: 1000 mL

## 2024-06-05 MED ORDER — OXYCODONE HCL 5 MG PO TABS: 5.0000 mg | ORAL_TABLET | Freq: Once | ORAL | Status: DC | PRN

## 2024-06-05 MED ORDER — METHYLENE BLUE (ANTIDOTE) 1 % IV SOLN
INTRAVENOUS | Status: AC
  Filled 2024-06-05: qty 10

## 2024-06-05 MED ORDER — KETOROLAC TROMETHAMINE 30 MG/ML IJ SOLN: 30.0000 mg | Freq: Once | INTRAMUSCULAR | Status: DC | PRN

## 2024-06-05 MED ORDER — ACETAMINOPHEN 325 MG PO TABS: 650.0000 mg | ORAL_TABLET | ORAL | Status: DC | PRN

## 2024-06-05 MED ORDER — EPHEDRINE SULFATE (PRESSORS) 50 MG/ML IJ SOLN
INTRAMUSCULAR | Status: DC | PRN
  Administered 2024-06-05: 5 mg via INTRAVENOUS

## 2024-06-05 MED ORDER — DEXAMETHASONE SODIUM PHOSPHATE 10 MG/ML IJ SOLN
INTRAMUSCULAR | Status: DC | PRN
  Administered 2024-06-05: 10 mg via INTRAVENOUS

## 2024-06-05 MED ORDER — ONDANSETRON HCL 4 MG/2ML IJ SOLN
4.0000 mg | Freq: Once | INTRAMUSCULAR | Status: AC | PRN
  Administered 2024-06-05: 4 mg via INTRAVENOUS

## 2024-06-05 MED ORDER — METRONIDAZOLE 500 MG/100ML IV SOLN
500.0000 mg | Freq: Once | INTRAVENOUS | Status: AC
  Administered 2024-06-05: 500 mg via INTRAVENOUS

## 2024-06-05 MED ORDER — SODIUM CHLORIDE 0.9 % IV SOLN
2.0000 g | INTRAVENOUS | Status: AC
  Administered 2024-06-05: 2 g via INTRAVENOUS

## 2024-06-05 MED ORDER — CEFAZOLIN SODIUM 1 G IJ SOLR
INTRAMUSCULAR | Status: DC | PRN
  Administered 2024-06-05: 1000 mL

## 2024-06-05 MED ORDER — KETAMINE HCL 50 MG/5ML IJ SOSY
PREFILLED_SYRINGE | INTRAMUSCULAR | Status: AC
  Filled 2024-06-05: qty 5

## 2024-06-05 MED ORDER — ORAL CARE MOUTH RINSE: 15.0000 mL | Freq: Once | OROMUCOSAL | Status: AC

## 2024-06-05 MED ORDER — SODIUM CHLORIDE 0.9 % IV SOLN
INTRAVENOUS | Status: AC
  Filled 2024-06-05: qty 2

## 2024-06-05 MED ORDER — DEXAMETHASONE SODIUM PHOSPHATE 10 MG/ML IJ SOLN
INTRAMUSCULAR | Status: AC
  Filled 2024-06-05: qty 1

## 2024-06-05 MED ORDER — ONDANSETRON HCL 4 MG PO TABS: 4.0000 mg | ORAL_TABLET | Freq: Four times a day (QID) | ORAL | Status: DC | PRN

## 2024-06-05 MED ORDER — HYDROMORPHONE HCL 1 MG/ML IJ SOLN
INTRAMUSCULAR | Status: DC | PRN
  Administered 2024-06-05: .5 mg via INTRAVENOUS

## 2024-06-05 MED ORDER — LIDOCAINE 2% (20 MG/ML) 5 ML SYRINGE
INTRAMUSCULAR | Status: DC | PRN
  Administered 2024-06-05: 60 mg via INTRAVENOUS

## 2024-06-05 MED ORDER — SODIUM CHLORIDE (PF) 0.9 % IJ SOLN
INTRAMUSCULAR | Status: AC
  Filled 2024-06-05: qty 10

## 2024-06-05 MED ORDER — POVIDONE-IODINE 10 % EX SWAB
2.0000 | Freq: Once | CUTANEOUS | Status: AC
  Administered 2024-06-05: 2 via TOPICAL

## 2024-06-05 MED ORDER — IBUPROFEN 600 MG PO TABS: 600.0000 mg | ORAL_TABLET | Freq: Four times a day (QID) | ORAL | Status: DC

## 2024-06-05 MED ORDER — CHLORHEXIDINE GLUCONATE 0.12 % MT SOLN
OROMUCOSAL | Status: AC
  Filled 2024-06-05: qty 15

## 2024-06-05 MED ORDER — PANTOPRAZOLE SODIUM 40 MG IV SOLR: 40.0000 mg | Freq: Every day | INTRAVENOUS | Status: DC

## 2024-06-05 MED ORDER — FENTANYL CITRATE (PF) 250 MCG/5ML IJ SOLN
INTRAMUSCULAR | Status: AC
  Filled 2024-06-05: qty 5

## 2024-06-05 MED ORDER — EPHEDRINE SULFATE-NACL 50-0.9 MG/10ML-% IV SOSY
PREFILLED_SYRINGE | INTRAVENOUS | Status: DC | PRN
Start: 2024-06-05 — End: 2024-06-05
  Administered 2024-06-05: 5 mg via INTRAVENOUS

## 2024-06-05 MED ORDER — SODIUM CHLORIDE 0.9 % IV SOLN
INTRAVENOUS | Status: DC
  Filled 2024-06-05: qty 10

## 2024-06-05 MED ORDER — KETOROLAC TROMETHAMINE 30 MG/ML IJ SOLN
INTRAMUSCULAR | Status: DC | PRN
Start: 2024-06-05 — End: 2024-06-05
  Administered 2024-06-05: 30 mg via INTRAVENOUS

## 2024-06-05 MED ORDER — PROPOFOL 10 MG/ML IV BOLUS
INTRAVENOUS | Status: AC
  Filled 2024-06-05: qty 20

## 2024-06-05 MED ORDER — METRONIDAZOLE 500 MG/100ML IV SOLN
INTRAVENOUS | Status: AC
  Filled 2024-06-05: qty 100

## 2024-06-05 SURGICAL SUPPLY — 50 items
APPLICATOR ARISTA FLEXITIP XL (MISCELLANEOUS) IMPLANT
BARRIER ADHS 3X4 INTERCEED (GAUZE/BANDAGES/DRESSINGS) IMPLANT
COVER BACK TABLE 60X90IN (DRAPES) ×3 IMPLANT
COVER TIP SHEARS 8 DVNC (MISCELLANEOUS) ×3 IMPLANT
DEFOGGER SCOPE WARM SEASHARP (MISCELLANEOUS) ×3 IMPLANT
DERMABOND ADVANCED .7 DNX12 (GAUZE/BANDAGES/DRESSINGS) ×3 IMPLANT
DRAPE ARM DVNC X/XI (DISPOSABLE) ×12 IMPLANT
DRAPE COLUMN DVNC XI (DISPOSABLE) ×3 IMPLANT
DRAPE SURG IRRIG POUCH 19X23 (DRAPES) ×3 IMPLANT
DRAPE UTILITY XL STRL (DRAPES) ×3 IMPLANT
DRIVER NDL MEGA SUTCUT DVNCXI (INSTRUMENTS) ×2 IMPLANT
DRIVER NDLE MEGA SUTCUT DVNCXI (INSTRUMENTS) ×3 IMPLANT
DURAPREP 26ML APPLICATOR (WOUND CARE) ×3 IMPLANT
ELECTRODE REM PT RTRN 9FT ADLT (ELECTROSURGICAL) ×3 IMPLANT
FORCEPS PROGRASP DVNC XI (FORCEP) ×3 IMPLANT
GAUZE 4X4 16PLY ~~LOC~~+RFID DBL (SPONGE) IMPLANT
GLOVE BIOGEL PI IND STRL 7.0 (GLOVE) ×6 IMPLANT
GLOVE SURG SS PI 6.0 STRL IVOR (GLOVE) ×4 IMPLANT
GOWN STRL REUS W/ TWL LRG LVL3 (GOWN DISPOSABLE) ×3 IMPLANT
HEMOSTAT ARISTA ABSORB 3G PWDR (HEMOSTASIS) IMPLANT
HOLDER FOLEY CATH W/STRAP (MISCELLANEOUS) IMPLANT
IRRIGATION SUCT STRKRFLW 2 WTP (MISCELLANEOUS) ×3 IMPLANT
KIT PINK PAD W/HEAD ARM REST (MISCELLANEOUS) ×3 IMPLANT
KIT TURNOVER KIT B (KITS) ×3 IMPLANT
LEGGING LITHOTOMY PAIR STRL (DRAPES) ×3 IMPLANT
MANIFOLD NEPTUNE II (INSTRUMENTS) ×3 IMPLANT
NS IRRIG 1000ML POUR BTL (IV SOLUTION) ×3 IMPLANT
OBTURATOR OPTICALSTD 8 DVNC (TROCAR) ×3 IMPLANT
OCCLUDER COLPOPNEUMO (BALLOONS) IMPLANT
PACK CYSTO (CUSTOM PROCEDURE TRAY) ×3 IMPLANT
PACK ROBOT WH (CUSTOM PROCEDURE TRAY) ×3 IMPLANT
PACK ROBOTIC GOWN (GOWN DISPOSABLE) ×3 IMPLANT
PAD OB MATERNITY 11 LF (PERSONAL CARE ITEMS) ×3 IMPLANT
RUMI II 3.0CM BLUE KOH-EFFICIE (DISPOSABLE) ×1 IMPLANT
RUMI II GYRUS 2.5CM BLUE (DISPOSABLE) IMPLANT
RUMI II GYRUS 3.5CM BLUE (DISPOSABLE) IMPLANT
RUMI II GYRUS 4.0CM BLUE (DISPOSABLE) IMPLANT
SCISSORS MNPLR CVD DVNC XI (INSTRUMENTS) ×3 IMPLANT
SEAL UNIV 5-12 XI (MISCELLANEOUS) ×9 IMPLANT
SEALER VESSEL EXT DVNC XI (MISCELLANEOUS) IMPLANT
SET CYSTO W/LG BORE CLAMP LF (SET/KITS/TRAYS/PACK) ×3 IMPLANT
SET TUBE SMOKE EVAC HIGH FLOW (TUBING) ×3 IMPLANT
SPIKE FLUID TRANSFER (MISCELLANEOUS) ×3 IMPLANT
SUT MNCRL AB 4-0 PS2 18 (SUTURE) ×3 IMPLANT
SUT VLOC 180 0 9IN GS21 (SUTURE) ×6 IMPLANT
TIP UTERINE 6.7X10CM GRN DISP (MISCELLANEOUS) IMPLANT
TIP UTERINE 6.7X8CM BLUE DISP (MISCELLANEOUS) ×1 IMPLANT
TOWEL GREEN STERILE (TOWEL DISPOSABLE) ×3 IMPLANT
TRAY FOLEY W/BAG SLVR 14FR LF (SET/KITS/TRAYS/PACK) ×1 IMPLANT
UNDERPAD 30X36 HEAVY ABSORB (UNDERPADS AND DIAPERS) ×3 IMPLANT

## 2024-06-05 NOTE — Transfer of Care (Signed)
 Immediate Anesthesia Transfer of Care Note  Patient: Cathy Turner  Procedure(s) Performed: HYSTERECTOMY, TOTAL, ROBOT-ASSISTED, LAPAROSCOPIC, WITH BILATERAL SALPINGECTOMY (Bilateral: Abdomen) CYSTOSCOPY (Bladder)  Patient Location: PACU  Anesthesia Type:General  Level of Consciousness: awake, alert , oriented, and patient cooperative  Airway & Oxygen Therapy: Patient Spontanous Breathing  Post-op Assessment: Report given to RN, Post -op Vital signs reviewed and stable, Patient moving all extremities X 4, and Patient able to stick tongue midline  Post vital signs: Reviewed and stable  Last Vitals:  Vitals Value Taken Time  BP 141/77   Temp 97.8   Pulse 83   Resp 11   SpO2 99     Last Pain:  Vitals:   06/05/24 0925  TempSrc: Oral  PainSc: 0-No pain      Patients Stated Pain Goal: 7 (06/05/24 0925)  Complications: No notable events documented.

## 2024-06-05 NOTE — Discharge Instructions (Addendum)
  Post Anesthesia Home Care Instructions  Activity: Get plenty of rest for the remainder of the day. A responsible individual must stay with you for 24 hours following the procedure.  For the next 24 hours, DO NOT: -Drive a car -Advertising copywriter -Drink alcoholic beverages -Take any medication unless instructed by your physician -Make any legal decisions or sign important papers.  Meals: Start with liquid foods such as gelatin or soup. Progress to regular foods as tolerated. Avoid greasy, spicy, heavy foods. If nausea and/or vomiting occur, drink only clear liquids until the nausea and/or vomiting subsides. Call your physician if vomiting continues.  Special Instructions/Symptoms: Your throat may feel dry or sore from the anesthesia or the breathing tube placed in your throat during surgery. If this causes discomfort, gargle with warm salt water. The discomfort should disappear within 24 hours.  May take Tylenol  for cramping/discomfort beginning at 3:30 PM.

## 2024-06-05 NOTE — Anesthesia Postprocedure Evaluation (Signed)
 Anesthesia Post Note  Patient: Cathy Turner  Procedure(s) Performed: HYSTERECTOMY, TOTAL, ROBOT-ASSISTED, LAPAROSCOPIC, WITH BILATERAL SALPINGECTOMY (Bilateral: Abdomen) CYSTOSCOPY (Bladder)     Patient location during evaluation: PACU Anesthesia Type: General Level of consciousness: awake and alert Pain management: pain level controlled Vital Signs Assessment: post-procedure vital signs reviewed and stable Respiratory status: spontaneous breathing, nonlabored ventilation, respiratory function stable and patient connected to nasal cannula oxygen Cardiovascular status: blood pressure returned to baseline and stable Postop Assessment: no apparent nausea or vomiting Anesthetic complications: no   No notable events documented.  Last Vitals:  Vitals:   06/05/24 1415 06/05/24 1430  BP: 129/83 124/77  Pulse: 78 77  Resp: 15 10  Temp:  36.6 C  SpO2: 95% 93%    Last Pain:  Vitals:   06/05/24 1400  TempSrc:   PainSc: 3                  Garnette DELENA Gab

## 2024-06-05 NOTE — Op Note (Signed)
 06/05/2024  [id number] Leeroy CHRISTELLA Locke        OPERATIVE REPORT   Preop Diagnosis: menorrhagia, dysmenorrhea, anemia, adenomyosis Procedure: robotic hysterectomy, bilateral salpingectomy, cystoscopy   Surgeon: Dr. Almarie Rollo Carpen Assistant:    Fluids: please see anesthesia report   Complications: None Anesthesia: General     Findings:  boggy contour 8cm uterus, normal ovaries and tubes, no endometriosis Cystoscopy at the end of the case with normal bladder and patent ureters bilaterally.   Estimated blood loss: Minimal <15cc   Specimens: Uterus, cervix and bilateral tubes   Disposition of specimen: Pathology           Patient is taken to the operating room. She is placed in the supine position. She is a running IV in place. Informed consent was present on the chart. SCDs on her lower extremities and functioning properly. Patient was positioned while she was awake.  Her legs were placed in the low lithotomy position in [place] stirrups. Her arms were tucked by the side.  General endotracheal anesthesia was administered by the anesthesia staff without difficulty.      Dura prep was then used to prep the abdomen and Hibiclens  was used to prep the inner thighs, perineum and vagina. Once 3 minutes had past the patient was draped in a normal standard fashion. A proper time out was performed and everyone agreed.  The legs were lifted to the high lithotomy position. A bivalve speculum was inserted into the vagina and the anterior lip of the cervix was grasped with single-tooth tenaculum.  The uterus sounded to 8 cm. Pratt dilators were used to dilate the cervix.  The RUMI uterine manipulator was obtained inserted into the endometrial cavity and the bulb of the disposable tip was inflated with 8 cc of normal saline. There was a good fit of the KOH ring around the cervix. The tenaculum and bivavle speculum was removed. There is also good manipulation of the uterus.  A Foley catheter  was placed to straight drain.  Clear urine was noted. Legs were lowered to the low lithotomy position and attention was turned the abdomen.   Superior to the umbilicus, marcaine  0.25% used to anesthetize the skin.  Using #11 blade, 8mm skin incision was made.  The 8mm robotic trocar and sleeve was inserted under direct visualization.  CO2 gas was  started and patient was placed in trendelenburg position.  Two additional 8mm ports were placed under direct visualization in the left and right lower quadrant.     Ureters were identifies.  Attention was turned to the left side. The left tube was elevated and the mesosalpinx was desiccated with the vessel sealer.  The left uterine ovarian pedicle was serially clamped cauterized and incised. Left round ligament was serially clamped cauterized and incised. The anterior and posterior peritoneum of the inferior leaf of the broad ligament were opened. The beginning of the bladder flap was created.  The bladder was taken down below the level of the KOH ring. The left uterine artery skeletonized and then just superior to the KOH ring this vessel was serially clamped, cauterized, and incised.   Attention was turned the right side.  The uterus was placed on stretch to the opposite side.    The mesosalpinx was incised freeing the tube. Then the right uterine ovarian pedicle was serially clamped cauterized and incised. Next the right round ligament was serially clamped cauterized and incised. The anterior posterior peritoneum of the inferiorly for the broad ligament were opened.  The anterior peritoneum was carried across to the dissection on the left side. The remainder of the bladder flap was created using sharp dissection. The bladder was well below the level of the KOH ring. The right uterine artery skeletonized. Then the right uterine artery, above the level of the KOH ring, was serially clamped cauterized and incised. The uterus was devascularized at this point.   The  colpotomy was performed.  This was carried around a circumferential fashion until the vaginal mucosa was completely incised in the specimen was freed.  The specimen was then delivered to the vagina intact.  A vaginal occlusive device was used to maintain the pneumoperitoneum   Instruments were changed with a needle driver and prograsp.  Using a 9 inch  zero V-lock suture, the cuff was closed by incorporating the anterior and posterior vaginal mucosa in each stitch. This was carried across all the way to the left corner and a running fashion. Two stitches were brought back towards the midline and the suture was cut flush with the vagina. The peritoneum was incorporated into the second stitch over the vaginal cuff as additional support. The needle was brought out the pelvis. The pelvis was irrigated. All pedicles were inspected. No bleeding was noted.   Co2 pressures were lowered to 8mm Hg.  Again, no bleeding was noted.  Ureters were noted deep in the pelvis to be peristalsing.  At this point the procedure was completed.  The remaining instruments were removed.  The ports were removed under direct visualization of the laparoscope and the pneumoperitoneum was relieved.   The skin was then closed with subcuticular stitches of 3-0 Vicryl. The skin was cleansed Dermabond was applied. Attention was then turned the vagina and the cuff was inspected. No bleeding was noted.  The Foley catheter was removed.  Cystoscopy was performed.  No sutures or bladder injuries were noted.  Ureters were noted with normal urine jets from each one was seen.  Foley was left out after the cystoscopic fluid was drained and cystoscope removed.  Sponge, lap, needle, instrument counts were correct x2. Patient tolerated the procedure very well. She was awakened from anesthesia, extubated and taken to recovery in stable condition.     Patient counseled on importance of pelvic rest and risks with early intercourse. She is aware that she needs  to be cleared for intercourse.  Dr. Glennon

## 2024-06-05 NOTE — Anesthesia Procedure Notes (Signed)
 Procedure Name: Intubation Date/Time: 06/05/2024 11:30 AM  Performed by: Viviana Almarie DASEN, CRNAPre-anesthesia Checklist: Patient identified, Emergency Drugs available, Suction available and Patient being monitored Patient Re-evaluated:Patient Re-evaluated prior to induction Oxygen Delivery Method: Circle System Utilized Preoxygenation: Pre-oxygenation with 100% oxygen Induction Type: IV induction Ventilation: Mask ventilation without difficulty Laryngoscope Size: Mac and 3 Tube type: Oral Tube size: 7.0 mm Number of attempts: 1 Airway Equipment and Method: Stylet, Oral airway and Bite block Placement Confirmation: ETT inserted through vocal cords under direct vision, positive ETCO2 and breath sounds checked- equal and bilateral Secured at: 21 cm Tube secured with: Tape Dental Injury: Teeth and Oropharynx as per pre-operative assessment

## 2024-06-05 NOTE — Interval H&P Note (Signed)
 History and Physical Interval Note:  06/05/2024 11:11 AM  Cathy Turner  has presented today for surgery, with the diagnosis of pelvic pain, adenomyosis, dysmenorrhea, menorrhagia with irregular cycle.  The various methods of treatment have been discussed with the patient and family. After consideration of risks, benefits and other options for treatment, the patient has consented to  Procedure(s) with comments: HYSTERECTOMY, TOTAL, ROBOT-ASSISTED, LAPAROSCOPIC, WITH BILATERAL SALPINGO-OOPHORECTOMY (Bilateral) - with possible peritoneal stripping and excision of endometriosis EXCISION, ENDOMETRIOSIS, ROBOTIC ASSISTED, LAPAROSCOPIC (N/A) - with possible peritoneal stripping and excision of endometriosis CYSTOSCOPY (N/A) as a surgical intervention.  The patient's history has been reviewed, patient examined, no change in status, stable for surgery.  I have reviewed the patient's chart and labs.  Questions were answered to the patient's satisfaction.     Cathy Turner  Ovaries are to stay in if at all possible for hormone support.  Patient agrees Dr. Carpen

## 2024-06-06 ENCOUNTER — Encounter (HOSPITAL_COMMUNITY): Payer: Self-pay | Admitting: Obstetrics and Gynecology

## 2024-06-06 ENCOUNTER — Ambulatory Visit: Payer: Self-pay | Admitting: Obstetrics and Gynecology

## 2024-06-06 LAB — SURGICAL PATHOLOGY

## 2024-06-19 ENCOUNTER — Encounter: Admitting: Obstetrics and Gynecology

## 2024-06-30 ENCOUNTER — Other Ambulatory Visit: Payer: Self-pay | Admitting: Obstetrics and Gynecology

## 2024-07-02 NOTE — Telephone Encounter (Signed)
 Med refill request: Progesterone   Last AEX: 04/12/24 Next AEX: next OV 07/04/24 Last MMG (if hormonal med) 05/31/23 birads cat 1 neg  Refill authorized: last rx 03/29/24 #90 with 1 refill. Please advise

## 2024-07-04 ENCOUNTER — Encounter: Payer: Self-pay | Admitting: Obstetrics and Gynecology

## 2024-07-04 ENCOUNTER — Ambulatory Visit (INDEPENDENT_AMBULATORY_CARE_PROVIDER_SITE_OTHER): Admitting: Obstetrics and Gynecology

## 2024-07-04 VITALS — BP 108/80 | HR 84 | Wt 184.8 lb

## 2024-07-04 DIAGNOSIS — Z09 Encounter for follow-up examination after completed treatment for conditions other than malignant neoplasm: Secondary | ICD-10-CM

## 2024-07-04 NOTE — Progress Notes (Signed)
 Patient presents for 2 week postop from [facility], bilateral salpingectomy, cystoscopy. She is doing well. No fevers, VB, dysuria or severe abdominal pain.  BP 108/80   Pulse 84   Wt 184 lb 12.8 oz (83.8 kg)   LMP 05/07/2024 (Approximate)   SpO2 96%   BMI 30.75 kg/m   Abdomen: incisions I/c/d, NT, ND  A/p PO from [facility] 2 weeks doing well Encouraged no heavy lifting, pushing, pulling greater than 13 lbs for full 6 weeks 2. Pelvic rest until cleared. Counseled on risks of early intercourse 3. RTC with any concerns or with heavy bleeding, fevers or severe abdominal pain. 4. Can stop progesterone  and iron. Continue with daily vaginal estrogen cream for cuff healing. Dr. Glennon

## 2024-08-14 ENCOUNTER — Encounter: Payer: Self-pay | Admitting: Obstetrics and Gynecology

## 2024-08-14 ENCOUNTER — Other Ambulatory Visit: Payer: Self-pay | Admitting: Family Medicine

## 2024-08-14 ENCOUNTER — Ambulatory Visit (INDEPENDENT_AMBULATORY_CARE_PROVIDER_SITE_OTHER): Admitting: Obstetrics and Gynecology

## 2024-08-14 VITALS — BP 110/78 | HR 82 | Wt 184.0 lb

## 2024-08-14 DIAGNOSIS — Z09 Encounter for follow-up examination after completed treatment for conditions other than malignant neoplasm: Secondary | ICD-10-CM

## 2024-08-14 DIAGNOSIS — Z23 Encounter for immunization: Secondary | ICD-10-CM | POA: Diagnosis not present

## 2024-08-14 DIAGNOSIS — Z1231 Encounter for screening mammogram for malignant neoplasm of breast: Secondary | ICD-10-CM

## 2024-08-16 NOTE — Progress Notes (Signed)
   Acute Office Visit  Subjective:    Patient ID: Cathy Turner, female    DOB: 10/28/1976, 48 y.o.   MRN: [id number]   HPI 48 y.o. presents today for Post-op Follow-up (10 wk post op everything is going well /06/05/24 HYSTERECTOMY, TOTAL, LAPAROSCOPIC, ROBOT-ASSISTED WITH SALPINGECTOMY, EXCISION, ENDOMETRIOSIS, ROBOTIC ASSISTED, LAPAROSCOPIC, CYSTOSCOPY//PAP-04/12/24/Mammo-05/31/23/Colonoscopy-10/03/23) .  Patient's last menstrual period was 05/07/2024 (approximate).   SVE: cuff healed. Slight tenderness to palpation with qtip, good vaginal support, normal discharge, no VB Blood pressure 110/78, pulse 82, weight 184 lb (83.5 kg), last menstrual period 05/07/2024, SpO2 98%.  Review of Systems     Objective:    OBGyn Exam  BP 110/78   Pulse 82   Wt 184 lb (83.5 kg)   LMP 05/07/2024 (Approximate)   SpO2 98%   BMI 30.62 kg/m  Wt Readings from Last 3 Encounters:  08/14/24 184 lb (83.5 kg)  07/04/24 184 lb 12.8 oz (83.8 kg)  06/05/24 182 lb (82.6 kg)        Assessment & Plan:  10 wk PO doing well Pelvic rest 3 more weeks RTC for annual exams and with any concerns.  Cathy Turner

## 2024-08-28 ENCOUNTER — Ambulatory Visit
Admission: RE | Admit: 2024-08-28 | Discharge: 2024-08-28 | Disposition: A | Discharge status: Home / Self Care | Source: Ambulatory Visit | Attending: Family Medicine | Admitting: Family Medicine

## 2024-08-28 ENCOUNTER — Other Ambulatory Visit: Payer: Self-pay | Admitting: Family Medicine

## 2024-08-28 DIAGNOSIS — Z1231 Encounter for screening mammogram for malignant neoplasm of breast: Secondary | ICD-10-CM

## 2024-10-02 ENCOUNTER — Other Ambulatory Visit: Payer: Self-pay | Admitting: Obstetrics and Gynecology

## 2024-10-03 NOTE — Telephone Encounter (Signed)
 Med refill request: progesterone  (PROMETRIUM ) 100 MG capsule  Start: 07/02/24 Dispense:  90 capsules Refills:  3  Last AEX:  04/12/24 Next AEX:  Not yet scheduled Last MMG (if hormonal med):  08/28/24 Refill authorized? Please Advise.
